# Patient Record
Sex: Male | Born: 1970 | Race: White | Hispanic: No | Marital: Single | Smoking: Current every day smoker
Health system: Southern US, Community
[De-identification: ages and names within clinical notes are randomized; demographics above are authoritative.]

## PROBLEM LIST (undated history)

## (undated) HISTORY — PX: HERNIA REPAIR: SHX51

---

## 2007-02-10 ENCOUNTER — Emergency Department (HOSPITAL_COMMUNITY): Admission: EM | Admit: 2007-02-10 | Discharge: 2007-02-10 | Payer: Self-pay | Admitting: Emergency Medicine

## 2009-10-08 ENCOUNTER — Emergency Department (HOSPITAL_COMMUNITY): Admission: EM | Admit: 2009-10-08 | Discharge: 2009-10-08 | Payer: Self-pay | Admitting: Emergency Medicine

## 2010-12-03 LAB — DIFFERENTIAL
Basophils Absolute: 0.1 10*3/uL (ref 0.0–0.1)
Basophils Relative: 1 % (ref 0–1)
Eosinophils Absolute: 0.5 10*3/uL (ref 0.0–0.7)
Eosinophils Relative: 5 % (ref 0–5)
Lymphs Abs: 2.1 10*3/uL (ref 0.7–4.0)
Neutrophils Relative %: 68 % (ref 43–77)

## 2010-12-03 LAB — CBC
HCT: 44.2 % (ref 39.0–52.0)
MCHC: 34 g/dL (ref 30.0–36.0)
MCV: 88.2 fL (ref 78.0–100.0)
Platelets: 304 10*3/uL (ref 150–400)
RDW: 15.2 % (ref 11.5–15.5)

## 2010-12-03 LAB — BASIC METABOLIC PANEL
BUN: 16 mg/dL (ref 6–23)
CO2: 29 mEq/L (ref 19–32)
Chloride: 101 mEq/L (ref 96–112)
Creatinine, Ser: 1.19 mg/dL (ref 0.4–1.5)
Glucose, Bld: 118 mg/dL — ABNORMAL HIGH (ref 70–99)
Potassium: 3.6 mEq/L (ref 3.5–5.1)

## 2013-01-27 ENCOUNTER — Encounter (HOSPITAL_COMMUNITY): Payer: Self-pay

## 2013-01-27 ENCOUNTER — Emergency Department (HOSPITAL_COMMUNITY)
Admission: EM | Admit: 2013-01-27 | Discharge: 2013-01-27 | Disposition: A | Discharge status: Home / Self Care | Payer: PRIVATE HEALTH INSURANCE | Attending: Emergency Medicine | Admitting: Emergency Medicine

## 2013-01-27 DIAGNOSIS — S0181XA Laceration without foreign body of other part of head, initial encounter: Secondary | ICD-10-CM

## 2013-01-27 DIAGNOSIS — S0180XA Unspecified open wound of other part of head, initial encounter: Secondary | ICD-10-CM | POA: Insufficient documentation

## 2013-01-27 DIAGNOSIS — F17200 Nicotine dependence, unspecified, uncomplicated: Secondary | ICD-10-CM | POA: Diagnosis not present

## 2013-01-27 DIAGNOSIS — Z8719 Personal history of other diseases of the digestive system: Secondary | ICD-10-CM | POA: Insufficient documentation

## 2013-01-27 DIAGNOSIS — S0993XA Unspecified injury of face, initial encounter: Secondary | ICD-10-CM | POA: Diagnosis present

## 2013-01-27 MED ORDER — LIDOCAINE-EPINEPHRINE (PF) 1 %-1:200000 IJ SOLN
INTRAMUSCULAR | Status: AC
  Administered 2013-01-27: 10 mL
  Filled 2013-01-27: qty 10

## 2013-01-27 MED ORDER — BACITRACIN ZINC 500 UNIT/GM EX OINT
TOPICAL_OINTMENT | CUTANEOUS | Status: AC
  Administered 2013-01-27: 1
  Filled 2013-01-27: qty 1.8
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
## 2013-01-28 NOTE — ED Provider Notes (Signed)
Medical screening examination/treatment/procedure(s) were performed by non-physician practitioner and as supervising physician I was immediately available for consultation/collaboration.  Raeford Razor, MD 01/28/13 1714

## 2020-03-03 ENCOUNTER — Emergency Department (HOSPITAL_COMMUNITY)
Admission: EM | Admit: 2020-03-03 | Discharge: 2020-03-03 | Disposition: A | Discharge status: Home / Self Care | Payer: Medicare Other | Attending: Emergency Medicine | Admitting: Emergency Medicine

## 2020-03-03 ENCOUNTER — Other Ambulatory Visit: Payer: Self-pay

## 2020-03-03 ENCOUNTER — Encounter (HOSPITAL_COMMUNITY): Payer: Self-pay | Admitting: Emergency Medicine

## 2020-03-03 DIAGNOSIS — L02413 Cutaneous abscess of right upper limb: Secondary | ICD-10-CM | POA: Diagnosis present

## 2020-03-03 DIAGNOSIS — Z5321 Procedure and treatment not carried out due to patient leaving prior to being seen by health care provider: Secondary | ICD-10-CM | POA: Diagnosis not present

## 2020-03-03 LAB — CBC
HCT: 38.1 % — ABNORMAL LOW (ref 39.0–52.0)
Hemoglobin: 12.5 g/dL — ABNORMAL LOW (ref 13.0–17.0)
MCH: 29.2 pg (ref 26.0–34.0)
MCHC: 32.8 g/dL (ref 30.0–36.0)
MCV: 89 fL (ref 80.0–100.0)
Platelets: 319 10*3/uL (ref 150–400)
RBC: 4.28 MIL/uL (ref 4.22–5.81)
RDW: 14.9 % (ref 11.5–15.5)
WBC: 9.6 10*3/uL (ref 4.0–10.5)
nRBC: 0 % (ref 0.0–0.2)

## 2020-03-03 LAB — BASIC METABOLIC PANEL
Anion gap: 8 (ref 5–15)
BUN: 18 mg/dL (ref 6–20)
CO2: 26 mmol/L (ref 22–32)
Calcium: 8.5 mg/dL — ABNORMAL LOW (ref 8.9–10.3)
Chloride: 101 mmol/L (ref 98–111)
Creatinine, Ser: 1.14 mg/dL (ref 0.61–1.24)
GFR calc Af Amer: >60 mL/min (ref >60)
GFR calc non Af Amer: >60 mL/min (ref >60)
Glucose, Bld: 81 mg/dL (ref 70–99)
Potassium: 3.7 mmol/L (ref 3.5–5.1)
Sodium: 135 mmol/L (ref 135–145)

## 2020-03-03 NOTE — ED Notes (Signed)
Pt standing at the door as this RN was walking by. RN asked if pt needed anything, pt stated "I need to see someone cause I'm in so much god damn pain."

## 2020-03-03 NOTE — ED Triage Notes (Signed)
Patient has abscess on right arm that has been there x 2 days. Patient states severe pain x 2 hours ago. Patient does have swelling and redness that has spread up his right arm.

## 2020-03-03 NOTE — ED Notes (Signed)
Pt approached Nurse Station cursing at ED Staff. Pt then told this RN to "Suck my [employer] notified by Pts nurse and Pt walked out of department.

## 2020-03-03 NOTE — ED Notes (Signed)
Pt pacing the hallway, when RN told pt he would need to go back to his room pt stated he "didn't have enough room to walk around in there. And all of y'all are walking around here." When this RN reiterated to pt that he would need to go back to his room, pt started charging RN stating "I can do whatever the fuck I want bitch." security called and pt walking out.

## 2020-03-04 ENCOUNTER — Encounter (HOSPITAL_COMMUNITY): Payer: Self-pay | Admitting: Emergency Medicine

## 2020-03-04 ENCOUNTER — Other Ambulatory Visit: Payer: Self-pay

## 2020-03-04 ENCOUNTER — Emergency Department (HOSPITAL_COMMUNITY)
Admission: EM | Admit: 2020-03-04 | Discharge: 2020-03-04 | Disposition: A | Discharge status: Home / Self Care | Payer: Medicare Other | Attending: Emergency Medicine | Admitting: Emergency Medicine

## 2020-03-04 DIAGNOSIS — L089 Local infection of the skin and subcutaneous tissue, unspecified: Secondary | ICD-10-CM | POA: Diagnosis not present

## 2020-03-04 DIAGNOSIS — L02413 Cutaneous abscess of right upper limb: Secondary | ICD-10-CM | POA: Diagnosis present

## 2020-03-04 MED ORDER — DOXYCYCLINE HYCLATE 100 MG PO TABS
100.0000 mg | ORAL_TABLET | Freq: Once | ORAL | Status: AC
  Administered 2020-03-04: 100 mg via ORAL
  Filled 2020-03-04: qty 1

## 2020-03-04 MED ORDER — DOXYCYCLINE HYCLATE 100 MG PO CAPS: 100.0000 mg | ORAL_CAPSULE | Freq: Two times a day (BID) | ORAL | 0 refills | Status: AC

## 2020-03-04 NOTE — ED Provider Notes (Signed)
[facility] EMERGENCY DEPARTMENT Provider Note   CSN: [id number] Arrival date & time: 03/04/20  2094     History Chief Complaint  Patient presents with  . Abscess    Spencer Brown is a 49 y.o. male presenting with abscess.  Onset 3 days on arm.  Started with small cut, doesn't remember what cut it.  Had some drainage earlier.  Felt febrile.  He was seen at this hospital in another hospital in the past 24 hours and left from both places prior to being seen due to personal disagreements with the staff.  He has not been on antibiotics.  He does report his a prior history of "staph infection" in his other arm.    HPI     History reviewed. No pertinent past medical history.  There are no problems to display for this patient.   Past Surgical History:  Procedure Laterality Date  . HERNIA REPAIR         History reviewed. No pertinent family history.  Social History   Tobacco Use  . Smoking status: Current Every Day Smoker    Types: Cigarettes  . Smokeless tobacco: Never Used  Substance Use Topics  . Alcohol use: No  . Drug use: No    Home Medications Prior to Admission medications   Medication Sig Start Date End Date Taking? Authorizing Provider  doxycycline (VIBRAMYCIN) 100 MG capsule Take 1 capsule (100 mg total) by mouth 2 (two) times daily for 7 days. 03/04/20 03/11/20  Terald Sleeper, MD  ibuprofen (ADVIL,MOTRIN) 200 MG tablet Take 200 mg by mouth every 6 (six) hours as needed for pain.    [provider]    Allergies    Haldol [haloperidol lactate]  Review of Systems   Review of Systems  Constitutional: Positive for chills and fever.  Eyes: Negative for pain and visual disturbance.  Respiratory: Negative for cough and shortness of breath.   Cardiovascular: Negative for chest pain and palpitations.  Gastrointestinal: Negative for abdominal pain and vomiting.  Musculoskeletal: Negative for arthralgias and back pain.  Skin: Positive for  rash and wound.  Allergic/Immunologic: Negative for food allergies and immunocompromised state.  Neurological: Negative for syncope and speech difficulty.  Psychiatric/Behavioral: Negative for agitation and confusion.  All other systems reviewed and are negative.   Physical Exam Updated Vital Signs BP 110/64   Pulse 99   Resp 12   Ht 6\' 1"  (1.854 m)   Wt 68 kg   SpO2 97%   BMI 19.79 kg/m   Physical Exam Vitals and nursing note reviewed.  Constitutional:      Appearance: He is well-developed.  HENT:     Head: Normocephalic and atraumatic.  Eyes:     Conjunctiva/sclera: Conjunctivae normal.     Pupils: Pupils are equal, round, and reactive to light.  Cardiovascular:     Rate and Rhythm: Normal rate and regular rhythm.     Pulses: Normal pulses.  Pulmonary:     Effort: Pulmonary effort is normal. No respiratory distress.  Musculoskeletal:     Cervical back: Neck supple.     Comments: 3 cm firm nodule on right mid-forearm with small ulceration, no active drainage, no fluctuance, mild surrounding streaking erythema No significant flexor tendinopathy   Skin:    General: Skin is warm and dry.  Neurological:     General: No focal deficit present.     Mental Status: He is alert and oriented to person, place, and time.  ED Results / Procedures / Treatments   Labs (all labs ordered are listed, but only abnormal results are displayed) Labs Reviewed - No data to display  EKG None  Radiology No results found.  Procedures Procedures (including critical care time)  Medications Ordered in ED Medications  doxycycline (VIBRA-TABS) tablet 100 mg (100 mg Oral Given 03/04/20 3151)    ED Course  I have reviewed the triage vital signs and the nursing notes.  Pertinent labs & imaging results that were available during my care of the patient were reviewed by me and considered in my medical decision making (see chart for details).  49 year old male present emergency  department with nodule and skin infection on the right forearm.  This may been an abscess that spontaneously was draining earlier.  Examined this with the bedside ultrasound and found an approximate 2 cm deep region of tissue swelling underneath the skin, not extending into the tendon sheaths, without fluctuance or hypoechoic collection.  There is fluctuance on exam.  I do not think there is a drainable mass here, but rather tissue swelling.  I would proceed with doxycycline for MRSA coverage given his prior history of "staph infection" as well as his temperature here.  He is in agreement with this plan.  I have a low clinical suspicion for sepsis or bacteremia at the time of this presentation.  The patient is overall well appearing to me.    Final Clinical Impression(s) / ED Diagnoses Final diagnoses:  Skin infection    Rx / DC Orders ED Discharge Orders         Ordered    doxycycline (VIBRAMYCIN) 100 MG capsule  2 times daily     Discontinue  Reprint     03/04/20 0740           Wyvonnia Dusky, MD 03/04/20 [phone]

## 2020-03-04 NOTE — ED Triage Notes (Signed)
Pt c/o of abscess on right arm x 3 day. 100.3 temp present.

## 2020-09-27 ENCOUNTER — Other Ambulatory Visit: Payer: Self-pay

## 2020-09-27 ENCOUNTER — Emergency Department (HOSPITAL_COMMUNITY)
Admission: EM | Admit: 2020-09-27 | Discharge: 2020-09-27 | Disposition: A | Discharge status: Home / Self Care | Payer: Medicare Other | Attending: Emergency Medicine | Admitting: Emergency Medicine

## 2020-09-27 DIAGNOSIS — N485 Ulcer of penis: Secondary | ICD-10-CM | POA: Insufficient documentation

## 2020-09-27 DIAGNOSIS — F17210 Nicotine dependence, cigarettes, uncomplicated: Secondary | ICD-10-CM | POA: Diagnosis not present

## 2020-09-27 DIAGNOSIS — N4889 Other specified disorders of penis: Secondary | ICD-10-CM | POA: Diagnosis present

## 2020-09-27 MED ORDER — DOXYCYCLINE HYCLATE 100 MG PO CAPS: 100.0000 mg | ORAL_CAPSULE | Freq: Two times a day (BID) | ORAL | 0 refills | Status: DC

## 2020-09-27 NOTE — ED Notes (Signed)
Discharge paperwork given to pt along with prescriptions. Pt agreeable to discharge and understands instructions. Esignature pad not working. 

## 2020-09-27 NOTE — ED Triage Notes (Signed)
Pt reports bug bite on penis yesterday. Today swollen and red.

## 2020-09-27 NOTE — ED Provider Notes (Signed)
[facility] EMERGENCY DEPARTMENT Provider Note   CSN: [id number] Arrival date & time: 09/27/20  1336     History No chief complaint on file.   Spencer Brown is a 50 y.o. male.  HPI 50 year old male who presents to the ER with a wound to his penis.  States he thinks he got bitten by a spider.  Reports that he is out in the woods a lot.  States he noticed a black wound at the top of the penis, has been itchy.  He was itching it last night, noticed the wound today.  Denies any dysuria, hematuria, pain with urination.  No testicular pain.  States he is just afraid that he is going to "lose his penis".  Denies any fevers or chills.  No noticeable pus or discharge.  He is sexually active with one partner and they do not use the barrier method.     No past medical history on file.  There are no problems to display for this patient.   Past Surgical History:  Procedure Laterality Date  . HERNIA REPAIR         No family history on file.  Social History   Tobacco Use  . Smoking status: Current Every Day Smoker    Types: Cigarettes  . Smokeless tobacco: Never Used  Substance Use Topics  . Alcohol use: No  . Drug use: No    Home Medications Prior to Admission medications   Medication Sig Start Date End Date Taking? Authorizing Provider  doxycycline (VIBRAMYCIN) 100 MG capsule Take 1 capsule (100 mg total) by mouth 2 (two) times daily. 09/27/20  Yes Mare Ferrari, PA-C  ibuprofen (ADVIL,MOTRIN) 200 MG tablet Take 200 mg by mouth every 6 (six) hours as needed for pain.    [provider]    Allergies    Haldol [haloperidol lactate]  Review of Systems   Review of Systems  Constitutional: Negative for chills and fever.  Cardiovascular: Negative for chest pain and palpitations.  Gastrointestinal: Negative for abdominal pain.  Genitourinary: Positive for genital sores. Negative for dysuria and hematuria.  Musculoskeletal: Negative for back  pain.  Skin: Positive for wound. Negative for color change and rash.  Hematological: Negative for adenopathy.  All other systems reviewed and are negative.   Physical Exam Updated Vital Signs BP 128/81 (BP Location: Right Arm)   Pulse 78   Temp 98 F (36.7 C) (Oral)   Resp 16   SpO2 100%   Physical Exam Vitals and nursing note reviewed. Exam conducted with a chaperone present.  Constitutional:      Appearance: He is well-developed and well-nourished.  HENT:     Head: Normocephalic and atraumatic.  Eyes:     Conjunctiva/sclera: Conjunctivae normal.  Cardiovascular:     Rate and Rhythm: Normal rate and regular rhythm.     Heart sounds: No murmur heard.   Pulmonary:     Effort: Pulmonary effort is normal. No respiratory distress.     Breath sounds: Normal breath sounds.  Abdominal:     Palpations: Abdomen is soft.     Tenderness: There is no abdominal tenderness.  Genitourinary:    Testes:        Right: Tenderness not present.        Left: Tenderness not present.     Epididymis:     Right: Normal.     Left: Normal.     Comments: Chaperone present.  Patient with a ulcerative appearing lesion  on the shaft of the penis below the glans.  No visible pus or discharge.  Nontender scrotum, no testicular or epididymal tenderness.  No visible erythema or discharge at the urethral orifice.  Nontender penile shaft. No visible warts Musculoskeletal:        General: No edema.     Cervical back: Neck supple.  Skin:    General: Skin is warm and dry.  Neurological:     Mental Status: He is alert.  Psychiatric:        Mood and Affect: Mood and affect normal.       ED Results / Procedures / Treatments   Labs (all labs ordered are listed, but only abnormal results are displayed) Labs Reviewed  RPR    EKG None  Radiology No results found.  Procedures Procedures (including critical care time)  Medications Ordered in ED Medications - No data to display  ED Course  I  have reviewed the triage vital signs and the nursing notes.  Pertinent labs & imaging results that were available during my care of the patient were reviewed by me and considered in my medical decision making (see chart for details).    MDM Rules/Calculators/A&P                          50 year old male who presents with a wound on the penis.  No difficulty urinating, dysuria, penile discharge.  Unclear if this is a bug bite or possible syphilis.  Low suspicion for gonorrhea/chlamydia, UTI, testicular torsion, epididymitis.  Will test RPR today.  Will start on doxycycline, and instructed the patient to keep the wound clean and dry.  Encouraged follow-up on the results via the MyChart app.  Referred to urology, stressed follow-up.  Return precautions discussed.  He voiced understanding and is agreeable.  Discussed the case with Dr. Effie Shy who is agreeable to the above plan and disposition  Final Clinical Impression(s) / ED Diagnoses Final diagnoses:  Penile ulcer    Rx / DC Orders ED Discharge Orders         Ordered    doxycycline (VIBRAMYCIN) 100 MG capsule  2 times daily        09/27/20 1606           Leone Brand 09/27/20 1612    Mancel Bale, MD 09/28/20 1151

## 2020-09-27 NOTE — Discharge Instructions (Signed)
Please keep the area clean and dry. Take antibiotic as prescribed until finished. Please follow up with [facility] whose phone number I have provided in your discharge paperwork.  It is very important that you follow-up with them to ensure appropriate healing of your wound.  Your test for syphilis will be available via the MyChart.  There is instructions in your discharge paperwork on how to download this.  Return to the ER for any new or worsening symptoms.

## 2020-09-28 ENCOUNTER — Telehealth: Payer: Self-pay

## 2020-09-28 LAB — RPR: RPR Ser Ql: NONREACTIVE

## 2020-09-28 NOTE — Telephone Encounter (Signed)
Patient called and asked for a f/u appointment from his ER visit. I asked his name and DOB and he reluctantly gave it me. I looked up his ER D/C summary to see why he needed to be seen because the patient would not tell me. I saw that he had a penile ulcer as the diagnosis and I told him I needed to talk to our nurse and get approval to add him to this weeks schedule. He proceeded to scream at me and called me a damn bitch repeatedly and also said that I needed to find a new job. He then said for me to take his name out of my computer and hung up.

## 2022-02-05 ENCOUNTER — Telehealth (HOSPITAL_COMMUNITY): Payer: Self-pay

## 2022-02-05 NOTE — Telephone Encounter (Signed)
Patient called in requesting services for behavioral health. I asked patient questions to see exactly what he was needing, (referral, therapist, med mgt) tried to explain each, but patient became very angry as he stated I was going in circles and not stating what he needed. Patient began to get very rude and cursed, warned patient, but continued to speak bad to me. Patient then stated that is not what the F** I asked you, ended called.

## 2022-02-19 ENCOUNTER — Ambulatory Visit (INDEPENDENT_AMBULATORY_CARE_PROVIDER_SITE_OTHER): Payer: Medicare Other

## 2022-02-19 ENCOUNTER — Encounter: Payer: Self-pay | Admitting: Nurse Practitioner

## 2022-02-19 ENCOUNTER — Ambulatory Visit (INDEPENDENT_AMBULATORY_CARE_PROVIDER_SITE_OTHER): Payer: Medicare Other | Admitting: Nurse Practitioner

## 2022-02-19 VITALS — BP 114/73 | HR 69 | Temp 98.7°F | Ht 73.0 in | Wt 149.4 lb

## 2022-02-19 DIAGNOSIS — M25511 Pain in right shoulder: Secondary | ICD-10-CM

## 2022-02-19 DIAGNOSIS — G8929 Other chronic pain: Secondary | ICD-10-CM | POA: Diagnosis not present

## 2022-02-19 DIAGNOSIS — Z0000 Encounter for general adult medical examination without abnormal findings: Secondary | ICD-10-CM

## 2022-02-19 DIAGNOSIS — Z7689 Persons encountering health services in other specified circumstances: Secondary | ICD-10-CM | POA: Insufficient documentation

## 2022-02-19 LAB — COMPREHENSIVE METABOLIC PANEL
Albumin/Globulin Ratio: 1.8 (ref 1.2–2.2)
Albumin: 4.5 g/dL (ref 4.0–5.0)
BUN/Creatinine Ratio: 11 (ref 9–20)
CO2: 25 mmol/L (ref 20–29)
Calcium: 9.5 mg/dL (ref 8.7–10.2)
Globulin, Total: 2.5 g/dL (ref 1.5–4.5)
Glucose: 93 mg/dL (ref 70–99)
Potassium: 4.4 mmol/L (ref 3.5–5.2)

## 2022-02-19 LAB — CBC WITH DIFFERENTIAL/PLATELET
EOS (ABSOLUTE): 0.2 10*3/uL (ref 0.0–0.4)
Lymphocytes Absolute: 1.5 10*3/uL (ref 0.7–3.1)
Lymphs: 22 %
MCH: 30.5 pg (ref 26.6–33.0)
MCHC: 33.6 g/dL (ref 31.5–35.7)
Monocytes Absolute: 0.5 10*3/uL (ref 0.1–0.9)
Monocytes: 8 %
Neutrophils Absolute: 4.6 10*3/uL (ref 1.4–7.0)
WBC: 7 10*3/uL (ref 3.4–10.8)

## 2022-02-19 LAB — LIPID PANEL

## 2022-02-19 MED ORDER — IBUPROFEN 600 MG PO TABS: 600.0000 mg | ORAL_TABLET | Freq: Three times a day (TID) | ORAL | 0 refills | Status: DC | PRN

## 2022-02-19 NOTE — Assessment & Plan Note (Signed)
Patient is new establishing care today provided education to patient on preventative care and healthcare maintenance.  Printed handouts given.  Patient declined his tetanus shot, Cologuard, and Shingrix.

## 2022-02-19 NOTE — Assessment & Plan Note (Signed)
Patient has not had a physical since 2008.  He is new to practice today completed head to toe assessment.  Completed labs CBC, CMP, lipid panel, HIV, hepatitis C antibody.  Follow-up in one year
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
## 2022-02-19 NOTE — Assessment & Plan Note (Signed)
Worsening chronic shoulder pain.  Completed right shoulder x-ray, anti-inflammatory for pain, in the future for physical therapy and orthopedic referral will be helpful to help patient alleviate symptoms.  Follow-up with worsening unresolved symptoms

## 2022-02-19 NOTE — Patient Instructions (Signed)
Health Maintenance, Male Adopting a healthy lifestyle and getting preventive care are important in promoting health and wellness. Ask your health care provider about: The right schedule for you to have regular tests and exams. Things you can do on your own to prevent diseases and keep yourself healthy. What should I know about diet, weight, and exercise? Eat a healthy diet  Eat a diet that includes plenty of vegetables, fruits, low-fat dairy products, and lean protein. Do not eat a lot of foods that are high in solid fats, added sugars, or sodium. Maintain a healthy weight Body mass index (BMI) is a measurement that can be used to identify possible weight problems. It estimates body fat based on height and weight. Your health care provider can help determine your BMI and help you achieve or maintain a healthy weight. Get regular exercise Get regular exercise. This is one of the most important things you can do for your health. Most adults should: Exercise for at least 150 minutes each week. The exercise should increase your heart rate and make you sweat (moderate-intensity exercise). Do strengthening exercises at least twice a week. This is in addition to the moderate-intensity exercise. Spend less time sitting. Even light physical activity can be beneficial. Watch cholesterol and blood lipids Have your blood tested for lipids and cholesterol at 50 years of age, then have this test every 5 years. You may need to have your cholesterol levels checked more often if: Your lipid or cholesterol levels are high. You are older than 50 years of age. You are at high risk for heart disease. What should I know about cancer screening? Many types of cancers can be detected early and may often be prevented. Depending on your health history and family history, you may need to have cancer screening at various ages. This may include screening for: Colorectal cancer. Prostate cancer. Skin cancer. Lung  cancer. What should I know about heart disease, diabetes, and high blood pressure? Blood pressure and heart disease High blood pressure causes heart disease and increases the risk of stroke. This is more likely to develop in people who have high blood pressure readings or are overweight. Talk with your health care provider about your target blood pressure readings. Have your blood pressure checked: Every 3-5 years if you are 18-39 years of age. Every year if you are 40 years old or older. If you are between the ages of 65 and 75 and are a current or former smoker, ask your health care provider if you should have a one-time screening for abdominal aortic aneurysm (AAA). Diabetes Have regular diabetes screenings. This checks your fasting blood sugar level. Have the screening done: Once every three years after age 45 if you are at a normal weight and have a low risk for diabetes. More often and at a younger age if you are overweight or have a high risk for diabetes. What should I know about preventing infection? Hepatitis B If you have a higher risk for hepatitis B, you should be screened for this virus. Talk with your health care provider to find out if you are at risk for hepatitis B infection. Hepatitis C Blood testing is recommended for: Everyone born from 1945 through 1965. Anyone with known risk factors for hepatitis C. Sexually transmitted infections (STIs) You should be screened each year for STIs, including gonorrhea and chlamydia, if: You are sexually active and are younger than 50 years of age. You are older than 50 years of age and your   health care provider tells you that you are at risk for this type of infection. Your sexual activity has changed since you were last screened, and you are at increased risk for chlamydia or gonorrhea. Ask your health care provider if you are at risk. Ask your health care provider about whether you are at high risk for HIV. Your health care provider  may recommend a prescription medicine to help prevent HIV infection. If you choose to take medicine to prevent HIV, you should first get tested for HIV. You should then be tested every 3 months for as long as you are taking the medicine. Follow these instructions at home: Alcohol use Do not drink alcohol if your health care provider tells you not to drink. If you drink alcohol: Limit how much you have to 0-2 drinks a day. Know how much alcohol is in your drink. In the [place] one drink equals one 12 oz bottle of beer (355 mL), one 5 oz glass of wine (148 mL), or one 1 oz glass of hard liquor (44 mL). Lifestyle Do not use any products that contain nicotine or tobacco. These products include cigarettes, chewing tobacco, and vaping devices, such as e-cigarettes. If you need help quitting, ask your health care provider. Do not use street drugs. Do not share needles. Ask your health care provider for help if you need support or information about quitting drugs. General instructions Schedule regular health, dental, and eye exams. Stay current with your vaccines. Tell your health care provider if: You often feel depressed. You have ever been abused or do not feel safe at home. Summary Adopting a healthy lifestyle and getting preventive care are important in promoting health and wellness. Follow your health care provider's instructions about healthy diet, exercising, and getting tested or screened for diseases. Follow your health care provider's instructions on monitoring your cholesterol and blood pressure. This information is not intended to replace advice given to you by your health care provider. Make sure you discuss any questions you have with your health care provider. Document Revised: 01/22/2021 Document Reviewed: 01/22/2021 Elsevier Patient Education  2023 [employer] Shoulder Pain Many things can cause shoulder pain, including: An injury. Moving the shoulder in the same way again and  again (overuse). Joint pain (arthritis). Pain can come from: Swelling and irritation (inflammation) of any part of the shoulder. An injury to the shoulder joint. An injury to: Tissues that connect muscle to bone (tendons). Tissues that connect bones to each other (ligaments). Bones. Follow these instructions at home: Watch for changes in your symptoms. Let your doctor know about them. Follow these instructions to help with your pain. If you have a sling: Wear the sling as told by your doctor. Remove it only as told by your doctor. Loosen the sling if your fingers: Tingle. Become numb. Turn cold and blue. Keep the sling clean. If the sling is not waterproof: Do not let it get wet. Take the sling off when you shower or bathe. Managing pain, stiffness, and swelling  If told, put ice on the painful area: Put ice in a plastic bag. Place a towel between your skin and the bag. Leave the ice on for 20 minutes, 2-3 times a day. Stop putting ice on if it does not help with the pain. Squeeze a soft ball or a foam pad as much as possible. This prevents swelling in the shoulder. It also helps to strengthen the arm. General instructions Take over-the-counter and prescription medicines only as told  by your doctor. Keep all follow-up visits as told by your doctor. This is important. Contact a doctor if: Your pain gets worse. Medicine does not help your pain. You have new pain in your arm, hand, or fingers. Get help right away if: Your arm, hand, or fingers: Tingle. Are numb. Are swollen. Are painful. Turn white or blue. Summary Shoulder pain can be caused by many things. These include injury, moving the shoulder in the same away again and again, and joint pain. Watch for changes in your symptoms. Let your doctor know about them. This condition may be treated with a sling, ice, and pain medicine. Contact your doctor if the pain gets worse or you have new pain. Get help right away if your  arm, hand, or fingers tingle or get numb, swollen, or painful. Keep all follow-up visits as told by your doctor. This is important. This information is not intended to replace advice given to you by your health care provider. Make sure you discuss any questions you have with your health care provider. Document Revised: 05/18/2021 Document Reviewed: 05/18/2021 Elsevier Patient Education  2023 [employer].

## 2022-02-20 LAB — COMPREHENSIVE METABOLIC PANEL
ALT: 21 IU/L (ref 0–44)
AST: 22 IU/L (ref 0–40)
Alkaline Phosphatase: 83 IU/L (ref 44–121)
BUN: 11 mg/dL (ref 6–24)
Bilirubin Total: 0.4 mg/dL (ref 0.0–1.2)
Chloride: 104 mmol/L (ref 96–106)
Creatinine, Ser: 1.01 mg/dL (ref 0.76–1.27)
Sodium: 141 mmol/L (ref 134–144)
Total Protein: 7 g/dL (ref 6.0–8.5)
eGFR: 91 mL/min/{1.73_m2} (ref >59)

## 2022-02-20 LAB — HEPATITIS C ANTIBODY: Hep C Virus Ab: NONREACTIVE

## 2022-02-20 LAB — CBC WITH DIFFERENTIAL/PLATELET
Basophils Absolute: 0.1 10*3/uL (ref 0.0–0.2)
Basos: 1 %
Eos: 3 %
Hematocrit: 45.6 % (ref 37.5–51.0)
Hemoglobin: 15.3 g/dL (ref 13.0–17.7)
Immature Grans (Abs): 0 10*3/uL (ref 0.0–0.1)
Immature Granulocytes: 0 %
MCV: 91 fL (ref 79–97)
Neutrophils: 66 %
Platelets: 374 10*3/uL (ref 150–450)
RBC: 5.02 x10E6/uL (ref 4.14–5.80)
RDW: 13.2 % (ref 11.6–15.4)

## 2022-02-20 LAB — LIPID PANEL
Chol/HDL Ratio: 4.1 ratio (ref 0.0–5.0)
Cholesterol, Total: 174 mg/dL (ref 100–199)
VLDL Cholesterol Cal: 19 mg/dL (ref 5–40)

## 2022-02-20 LAB — HIV ANTIBODY (ROUTINE TESTING W REFLEX): HIV Screen 4th Generation wRfx: NONREACTIVE

## 2022-03-28 ENCOUNTER — Ambulatory Visit (INDEPENDENT_AMBULATORY_CARE_PROVIDER_SITE_OTHER): Payer: Medicare Other | Admitting: Family Medicine

## 2022-03-28 ENCOUNTER — Encounter: Payer: Self-pay | Admitting: Family Medicine

## 2022-03-28 VITALS — BP 119/74 | HR 91 | Temp 97.7°F | Ht 73.0 in | Wt 143.8 lb

## 2022-03-28 DIAGNOSIS — L02212 Cutaneous abscess of back [any part, except buttock]: Secondary | ICD-10-CM

## 2022-03-28 MED ORDER — SULFAMETHOXAZOLE-TRIMETHOPRIM 800-160 MG PO TABS: 1.0000 | ORAL_TABLET | Freq: Two times a day (BID) | ORAL | 0 refills | Status: AC

## 2022-03-28 NOTE — Progress Notes (Signed)
Assessment & Plan:  1. Abscess of back - sulfamethoxazole-trimethoprim (BACTRIM DS) 800-160 MG tablet; Take 1 tablet by mouth 2 (two) times daily for 7 days.  Dispense: 14 tablet; Refill: 0 - I & D   Follow up plan: Return if symptoms worsen or fail to improve.  [name], MSN, APRN, FNP-C [facility]  Subjective:   Patient ID: Spencer Brown, male    DOB: [birth year], 51 y.o.   MRN: [id number]  HPI: Spencer Brown is a 51 y.o. male presenting on 03/28/2022 for Cyst (Patient states that he has had a cyst on his back since the beginning of the year.)  Patient reports a place on his back that has been present since the beginning of the year. He recently grabbed it with pliers at which time a large amount of pus came out. 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   ROS: Negative unless specifically indicated above in HPI.   Relevant past medical history reviewed and updated as indicated.   Allergies and medications reviewed and updated.   Current Outpatient Medications:    ibuprofen (ADVIL) 600 MG tablet, Take 1 tablet (600 mg total) by mouth every 8 (eight) hours as needed. (Patient not taking: Reported on 03/28/2022), Disp: 30 tablet, Rfl: 0  Allergies  Allergen Reactions   Haloperidol Other (See Comments)    Neck stiffness Other reaction(s): Other (See Comments) Neck stiffness Muscle stiffness/ seizure like symptoms     Bee Pollen    Haldol [Haloperidol Lactate] Other (See Comments)    Muscle stiffness/ seizure like symptoms     Objective:   BP 119/74   Pulse 91   Temp 97.7 F (36.5 C) (Temporal)   Ht 6\' 1"  (1.854 m)   Wt 143 lb 12.8 oz (65.2 kg)   SpO2 99%   BMI 18.97 kg/m    Physical Exam Vitals reviewed.  Constitutional:      General: He is not in acute distress.    Appearance: Normal appearance. He is not ill-appearing, toxic-appearing or diaphoretic.  HENT:     Head: Normocephalic and atraumatic.  Eyes:      General: No scleral icterus.       Right eye: No discharge.        Left eye: No discharge.     Conjunctiva/sclera: Conjunctivae normal.  Cardiovascular:     Rate and Rhythm: Normal rate.  Pulmonary:     Effort: Pulmonary effort is normal. No respiratory distress.  Musculoskeletal:        General: Normal range of motion.     Cervical back: Normal range of motion.  Skin:    General: Skin is warm and dry.     Findings: Abscess (left side of back) present.  Neurological:     Mental Status: He is alert and oriented to person, place, and time. Mental status is at baseline.  Psychiatric:        Mood and Affect: Mood normal.        Behavior: Behavior normal.        Thought Content: Thought content normal.        Judgment: Judgment normal.   I & D  Date/Time: 03/28/2022 8:15 AM  Performed by: 03/30/2022, FNP Authorized by: Gwenlyn Fudge, FNP   Consent:    Consent obtained:  Written   Consent given by:  Patient   Risks, benefits, and alternatives were discussed: yes     Risks  discussed:  Bleeding, incomplete drainage, pain and infection   Alternatives discussed:  Alternative treatment Location:    Type:  Abscess   Size:  1.5 cm   Location:  Trunk   Trunk location:  Back Pre-procedure details:    Skin preparation:  Alcohol and povidone-iodine Sedation:    Sedation type:  None Anesthesia:    Anesthesia method:  Topical application   Topical anesthesia: Ethyl chloride. Procedure type:    Complexity:  Simple Procedure details:    Needle aspiration: no     Incision types:  Stab incision   Incision depth:  Dermal   Scalpel blade:  11   Wound management:  Probed and deloculated   Drainage:  Bloody and purulent   Drainage amount:  Moderate   Wound treatment:  Wound left open   Packing materials:  None Post-procedure details:    Procedure completion:  Tolerated well, no immediate complications

## 2022-07-31 ENCOUNTER — Telehealth: Payer: Self-pay | Admitting: Nurse Practitioner

## 2022-07-31 NOTE — Telephone Encounter (Signed)
Spoke with patient to schedule AWVI and he has questions about giving stool sample.

## 2022-07-31 NOTE — Telephone Encounter (Signed)
Am not sure what patient is asking?

## 2022-07-31 NOTE — Telephone Encounter (Signed)
Attempted to call pt no answer left vm  

## 2022-08-07 ENCOUNTER — Telehealth: Payer: Self-pay

## 2022-08-07 NOTE — Telephone Encounter (Signed)
I have attempted to call pt a 2nd time no answer - left vm for cb - closing the encounter

## 2022-08-12 ENCOUNTER — Ambulatory Visit (INDEPENDENT_AMBULATORY_CARE_PROVIDER_SITE_OTHER): Payer: Medicare Other

## 2022-08-12 ENCOUNTER — Telehealth: Payer: Self-pay | Admitting: Nurse Practitioner

## 2022-08-12 VITALS — Ht 73.0 in | Wt 145.0 lb

## 2022-08-12 DIAGNOSIS — Z0000 Encounter for general adult medical examination without abnormal findings: Secondary | ICD-10-CM

## 2022-08-12 NOTE — Patient Instructions (Signed)
[name] , Thank you for taking time to come for your Medicare Wellness Visit. I appreciate your ongoing commitment to your health goals. Please review the following plan we discussed and let me know if I can assist you in the future.   These are the goals we discussed:  Goals      DIET - INCREASE WATER INTAKE        This is a list of the screening recommended for you and due dates:  Health Maintenance  Topic Date Due   COVID-19 Vaccine (1) Never done   Screening for Lung Cancer  Never done   Zoster (Shingles) Vaccine (1 of 2) Never done   Flu Shot  Never done   Colon Cancer Screening  02/20/2023*   Medicare Annual Wellness Visit  08/13/2023   Hepatitis C Screening: USPSTF Recommendation to screen - Ages 18-79 yo.  Completed   HIV Screening  Completed   HPV Vaccine  Aged Out  *Topic was postponed. The date shown is not the original due date.    Advanced directives: Advance directive discussed with you today. I have provided a copy for you to complete at home and have notarized. Once this is complete please bring a copy in to our office so we can scan it into your chart.   Conditions/risks identified: Aim for 30 minutes of exercise or brisk walking, 6-8 glasses of water, and 5 servings of fruits and vegetables each day.   Next appointment: Follow up in one year for your annual wellness visit   Preventive Care 40-64 Years, Male Preventive care refers to lifestyle choices and visits with your health care provider that can promote health and wellness. What does preventive care include? A yearly physical exam. This is also called an annual well check. Dental exams once or twice a year. Routine eye exams. Ask your health care provider how often you should have your eyes checked. Personal lifestyle choices, including: Daily care of your teeth and gums. Regular physical activity. Eating a healthy diet. Avoiding tobacco and drug use. Limiting alcohol use. Practicing safe  sex. Taking low-dose aspirin every day starting at age 61. What happens during an annual well check? The services and screenings done by your health care provider during your annual well check will depend on your age, overall health, lifestyle risk factors, and family history of disease. Counseling  Your health care provider may ask you questions about your: Alcohol use. Tobacco use. Drug use. Emotional well-being. Home and relationship well-being. Sexual activity. Eating habits. Work and work Astronomer. Screening  You may have the following tests or measurements: Height, weight, and BMI. Blood pressure. Lipid and cholesterol levels. These may be checked every 5 years, or more frequently if you are over 41 years old. Skin check. Lung cancer screening. You may have this screening every year starting at age 69 if you have a 30-pack-year history of smoking and currently smoke or have quit within the past 15 years. Fecal occult blood test (FOBT) of the stool. You may have this test every year starting at age 29. Flexible sigmoidoscopy or colonoscopy. You may have a sigmoidoscopy every 5 years or a colonoscopy every 10 years starting at age 58. Prostate cancer screening. Recommendations will vary depending on your family history and other risks. Hepatitis C blood test. Hepatitis B blood test. Sexually transmitted disease (STD) testing. Diabetes screening. This is done by checking your blood sugar (glucose) after you have not eaten for a while (fasting). You may  have this done every 1-3 years. Discuss your test results, treatment options, and if necessary, the need for more tests with your health care provider. Vaccines  Your health care provider may recommend certain vaccines, such as: Influenza vaccine. This is recommended every year. Tetanus, diphtheria, and acellular pertussis (Tdap, Td) vaccine. You may need a Td booster every 10 years. Zoster vaccine. You may need this after age  59. Pneumococcal 13-valent conjugate (PCV13) vaccine. You may need this if you have certain conditions and have not been vaccinated. Pneumococcal polysaccharide (PPSV23) vaccine. You may need one or two doses if you smoke cigarettes or if you have certain conditions. Talk to your health care provider about which screenings and vaccines you need and how often you need them. This information is not intended to replace advice given to you by your health care provider. Make sure you discuss any questions you have with your health care provider. Document Released: 09/29/2015 Document Revised: 05/22/2016 Document Reviewed: 07/04/2015 Elsevier Interactive Patient Education  2017 [place] Prevention in the Home Falls can cause injuries. They can happen to people of all ages. There are many things you can do to make your home safe and to help prevent falls. What can I do on the outside of my home? Regularly fix the edges of walkways and driveways and fix any cracks. Remove anything that might make you trip as you walk through a door, such as a raised step or threshold. Trim any bushes or trees on the path to your home. Use bright outdoor lighting. Clear any walking paths of anything that might make someone trip, such as rocks or tools. Regularly check to see if handrails are loose or broken. Make sure that both sides of any steps have handrails. Any raised decks and porches should have guardrails on the edges. Have any leaves, snow, or ice cleared regularly. Use sand or salt on walking paths during winter. Clean up any spills in your garage right away. This includes oil or grease spills. What can I do in the bathroom? Use night lights. Install grab bars by the toilet and in the tub and shower. Do not use towel bars as grab bars. Use non-skid mats or decals in the tub or shower. If you need to sit down in the shower, use a plastic, non-slip stool. Keep the floor dry. Clean up any water  that spills on the floor as soon as it happens. Remove soap buildup in the tub or shower regularly. Attach bath mats securely with double-sided non-slip rug tape. Do not have throw rugs and other things on the floor that can make you trip. What can I do in the bedroom? Use night lights. Make sure that you have a light by your bed that is easy to reach. Do not use any sheets or blankets that are too big for your bed. They should not hang down onto the floor. Have a firm chair that has side arms. You can use this for support while you get dressed. Do not have throw rugs and other things on the floor that can make you trip. What can I do in the kitchen? Clean up any spills right away. Avoid walking on wet floors. Keep items that you use a lot in easy-to-reach places. If you need to reach something above you, use a strong step stool that has a grab bar. Keep electrical cords out of the way. Do not use floor polish or wax that makes floors slippery. If  you must use wax, use non-skid floor wax. Do not have throw rugs and other things on the floor that can make you trip. What can I do with my stairs? Do not leave any items on the stairs. Make sure that there are handrails on both sides of the stairs and use them. Fix handrails that are broken or loose. Make sure that handrails are as long as the stairways. Check any carpeting to make sure that it is firmly attached to the stairs. Fix any carpet that is loose or worn. Avoid having throw rugs at the top or bottom of the stairs. If you do have throw rugs, attach them to the floor with carpet tape. Make sure that you have a light switch at the top of the stairs and the bottom of the stairs. If you do not have them, ask someone to add them for you. What else can I do to help prevent falls? Wear shoes that: Do not have high heels. Have rubber bottoms. Are comfortable and fit you well. Are closed at the toe. Do not wear sandals. If you use a  stepladder: Make sure that it is fully opened. Do not climb a closed stepladder. Make sure that both sides of the stepladder are locked into place. Ask someone to hold it for you, if possible. Clearly mark and make sure that you can see: Any grab bars or handrails. First and last steps. Where the edge of each step is. Use tools that help you move around (mobility aids) if they are needed. These include: Canes. Walkers. Scooters. Crutches. Turn on the lights when you go into a dark area. Replace any light bulbs as soon as they burn out. Set up your furniture so you have a clear path. Avoid moving your furniture around. If any of your floors are uneven, fix them. If there are any pets around you, be aware of where they are. Review your medicines with your doctor. Some medicines can make you feel dizzy. This can increase your chance of falling. Ask your doctor what other things that you can do to help prevent falls. This information is not intended to replace advice given to you by your health care provider. Make sure you discuss any questions you have with your health care provider. Document Released: 06/29/2009 Document Revised: 02/08/2016 Document Reviewed: 10/07/2014 Elsevier Interactive Patient Education  2017 [employer].

## 2022-08-12 NOTE — Telephone Encounter (Signed)
Patient would like to see a different PCP. He does not have a preference. Please call back to let him know.

## 2022-08-12 NOTE — Progress Notes (Signed)
Subjective:   Spencer Brown is a 51 y.o. male who presents for Medicare Annual/Subsequent preventive examination.I connected with  [name] on 08/12/22 by a audio enabled telemedicine application and verified that I am speaking with the correct person using two identifiers.  Patient Location: Home  Provider Location: Home Office  I discussed the limitations of evaluation and management by telemedicine. The patient expressed understanding and agreed to proceed.  Review of Systems     Cardiac Risk Factors include: advanced age (>8men, >48 women);male gender     Objective:    Today's Vitals   08/12/22 1009  Weight: 145 lb (65.8 kg)  Height: 6\' 1"  (1.854 m)   Body mass index is 19.13 kg/m.     08/12/2022   10:14 AM 03/04/2020    7:26 AM 03/03/2020    8:53 PM  Advanced Directives  Does Patient Have a Medical Advance Directive? No No No  Would patient like information on creating a medical advance directive? No - Patient declined      Current Medications (verified) Outpatient Encounter Medications as of 08/12/2022  Medication Sig   Multiple Vitamins-Minerals (MULTIVITAMIN WITH MINERALS) tablet Take 1 tablet by mouth daily.   ibuprofen (ADVIL) 600 MG tablet Take 1 tablet (600 mg total) by mouth every 8 (eight) hours as needed. (Patient not taking: Reported on 08/12/2022)   No facility-administered encounter medications on file as of 08/12/2022.    Allergies (verified) Haloperidol, Bee pollen, and Haldol [haloperidol lactate]   History: History reviewed. No pertinent past medical history. Past Surgical History:  Procedure Laterality Date   HERNIA REPAIR     Family History  Problem Relation Age of Onset   Cancer Maternal Grandmother    Cancer Maternal Grandfather    COPD Paternal Grandmother    Cancer Paternal Grandmother    Social History   Socioeconomic History   Marital status: Single    Spouse name: Not on file   Number of children: Not on  file   Years of education: Not on file   Highest education level: Not on file  Occupational History   Not on file  Tobacco Use   Smoking status: Every Day    Packs/day: 0.50    Years: 41.00    Total pack years: 20.50    Types: Cigarettes   Smokeless tobacco: Never  Vaping Use   Vaping Use: Never used  Substance and Sexual Activity   Alcohol use: Not Currently    Comment: stopped 8 years ago   Drug use: Yes    Types: Marijuana   Sexual activity: Yes    Birth control/protection: None  Other Topics Concern   Not on file  Social History Narrative   Not on file   Social Determinants of Health   Financial Resource Strain: Low Risk  (08/12/2022)   Overall Financial Resource Strain (CARDIA)    Difficulty of Paying Living Expenses: Not hard at all  Food Insecurity: No Food Insecurity (08/12/2022)   Hunger Vital Sign    Worried About Running Out of Food in the Last Year: Never true    Ran Out of Food in the Last Year: Never true  Transportation Needs: No Transportation Needs (08/12/2022)   PRAPARE - 08/14/2022 (Medical): No    Lack of Transportation (Non-Medical): No  Physical Activity: Insufficiently Active (08/12/2022)   Exercise Vital Sign    Days of Exercise per Week: 3 days    Minutes of Exercise  per Session: 30 min  Stress: No Stress Concern Present (08/12/2022)   [employer] of Occupational Health - Occupational Stress Questionnaire    Feeling of Stress : Not at all  Social Connections: Moderately Isolated (08/12/2022)   Social Connection and Isolation Panel [NHANES]    Frequency of Communication with Friends and Family: Twice a week    Frequency of Social Gatherings with Friends and Family: Twice a week    Attends Religious Services: 1 to 4 times per year    Active Member of [employer] or Organizations: No    Attends Engineer, structural: Never    Marital Status: Never married    Tobacco Counseling Ready to quit: Not  Answered Counseling given: Not Answered   Clinical Intake:  Pre-visit preparation completed: Yes  Pain : No/denies pain     Nutritional Risks: None Diabetes: No  How often do you need to have someone help you when you read instructions, pamphlets, or other written materials from your doctor or pharmacy?: 1 - Never  Diabetic?no  Interpreter Needed?: No  Information entered by :: Renie Ora, LPN   Activities of Daily Living    08/12/2022   10:15 AM  In your present state of health, do you have any difficulty performing the following activities:  Hearing? 0  Vision? 0  Difficulty concentrating or making decisions? 0  Walking or climbing stairs? 0  Dressing or bathing? 0  Doing errands, shopping? 0  Preparing Food and eating ? N  Using the Toilet? N  In the past six months, have you accidently leaked urine? N  Do you have problems with loss of bowel control? N  Managing your Medications? N  Managing your Finances? N  Housekeeping or managing your Housekeeping? N    Patient Care Team: Daryll Drown, NP as PCP - General (Nurse Practitioner)  Indicate any recent Medical Services you may have received from other than [facility] providers in the past year (date may be approximate).     Assessment:   This is a routine wellness examination for Spencer Brown.  Hearing/Vision screen Vision Screening - Comments:: Declined at this time   Dietary issues and exercise activities discussed: Current Exercise Habits: Home exercise routine, Type of exercise: walking, Time (Minutes): 30, Frequency (Times/Week): 3, Weekly Exercise (Minutes/Week): 90, Intensity: Mild, Exercise limited by: None identified   Goals Addressed             This Visit's Progress    DIET - INCREASE WATER INTAKE         Depression Screen    08/12/2022   10:12 AM 03/28/2022    8:11 AM 02/19/2022    8:27 AM  PHQ 2/9 Scores  PHQ - 2 Score 0 1 1  PHQ- 9 Score  5 3    Fall Risk    08/12/2022   10:10  AM 02/19/2022    8:26 AM  Fall Risk   Falls in the past year? 0 1  Number falls in past yr: 0 1  Injury with Fall? 0 1  Risk for fall due to : No Fall Risks History of fall(s)  Follow up Falls prevention discussed     FALL RISK PREVENTION PERTAINING TO THE HOME:  Any stairs in or around the home? No  If so, are there any without handrails? No  Home free of loose throw rugs in walkways, pet beds, electrical cords, etc? Yes  Adequate lighting in your home to reduce risk of falls? Yes  ASSISTIVE DEVICES UTILIZED TO PREVENT FALLS:  Life alert? No  Use of a cane, walker or w/c? No  Grab bars in the bathroom? No  Shower chair or bench in shower? No  Elevated toilet seat or a handicapped toilet? No          08/12/2022   10:16 AM  6CIT Screen  What Year? 0 points  What month? 0 points  What time? 0 points  Count back from 20 0 points  Months in reverse 0 points  Repeat phrase 0 points  Total Score 0 points    Immunizations  There is no immunization history on file for this patient.  TDAP status: Due, Education has been provided regarding the importance of this vaccine. Advised may receive this vaccine at local pharmacy or Health Dept. Aware to provide a copy of the vaccination record if obtained from local pharmacy or Health Dept. Verbalized acceptance and understanding.  Flu Vaccine status: Due, Education has been provided regarding the importance of this vaccine. Advised may receive this vaccine at local pharmacy or Health Dept. Aware to provide a copy of the vaccination record if obtained from local pharmacy or Health Dept. Verbalized acceptance and understanding.  Pneumococcal vaccine status: Due, Education has been provided regarding the importance of this vaccine. Advised may receive this vaccine at local pharmacy or Health Dept. Aware to provide a copy of the vaccination record if obtained from local pharmacy or Health Dept. Verbalized acceptance and  understanding.  Covid-19 vaccine status: Declined, Education has been provided regarding the importance of this vaccine but patient still declined. Advised may receive this vaccine at local pharmacy or Health Dept.or vaccine clinic. Aware to provide a copy of the vaccination record if obtained from local pharmacy or Health Dept. Verbalized acceptance and understanding.  Qualifies for Shingles Vaccine? Yes   Zostavax completed No   Shingrix Completed?: No.    Education has been provided regarding the importance of this vaccine. Patient has been advised to call insurance company to determine out of pocket expense if they have not yet received this vaccine. Advised may also receive vaccine at local pharmacy or Health Dept. Verbalized acceptance and understanding.  Screening Tests Health Maintenance  Topic Date Due   COVID-19 Vaccine (1) Never done   Lung Cancer Screening  Never done   Zoster Vaccines- Shingrix (1 of 2) Never done   INFLUENZA VACCINE  Never done   COLONOSCOPY (Pts 45-1824yrs Insurance coverage will need to be confirmed)  02/20/2023 (Originally 02/19/2017)   Medicare Annual Wellness (AWV)  08/13/2023   Hepatitis C Screening  Completed   HIV Screening  Completed   HPV VACCINES  Aged Out    Health Maintenance  Health Maintenance Due  Topic Date Due   COVID-19 Vaccine (1) Never done   Lung Cancer Screening  Never done   Zoster Vaccines- Shingrix (1 of 2) Never done   INFLUENZA VACCINE  Never done    Colorectal cancer screening: Referral to GI placed declined at this time . Pt aware the office will call re: appt.  Lung Cancer Screening: (Low Dose CT Chest recommended if Age 24-80 years, 30 pack-year currently smoking OR have quit w/in 15years.) does qualify.   Lung Cancer Screening Referral: patient declined   Additional Screening:  Hepatitis C Screening: does qualify; Declined at this time   Vision Screening: Recommended annual ophthalmology exams for early detection of  glaucoma and other disorders of the eye. Is the patient up to date with their  annual eye exam?  No  Who is the provider or what is the name of the office in which the patient attends annual eye exams? Patient declines  If pt is not established with a provider, would they like to be referred to a provider to establish care? No .   Dental Screening: Recommended annual dental exams for proper oral hygiene  Community Resource Referral / Chronic Care Management: CRR required this visit?  No   CCM required this visit?  No      Plan:     I have personally reviewed and noted the following in the patient's chart:   Medical and social history Use of alcohol, tobacco or illicit drugs  Current medications and supplements including opioid prescriptions. Patient is not currently taking opioid prescriptions. Functional ability and status Nutritional status Physical activity Advanced directives List of other physicians Hospitalizations, surgeries, and ER visits in previous 12 months Vitals Screenings to include cognitive, depression, and falls Referrals and appointments  In addition, I have reviewed and discussed with patient certain preventive protocols, quality metrics, and best practice recommendations. A written personalized care plan for preventive services as well as general preventive health recommendations were provided to patient.     Lorrene Reid, LPN   [id number]/06/[id number]   Nurse Notes: Patient declines Eye Doctor referral , any vaccines , Referral Colonoscopy declined

## 2022-08-12 NOTE — Telephone Encounter (Signed)
Attempted to call pt , no answer - pt can switch but we would need to know which provider and get confirmation form them and [facility]

## 2022-10-03 ENCOUNTER — Ambulatory Visit (INDEPENDENT_AMBULATORY_CARE_PROVIDER_SITE_OTHER): Payer: 59 | Admitting: Family Medicine

## 2022-10-03 ENCOUNTER — Encounter: Payer: Self-pay | Admitting: Family Medicine

## 2022-10-03 VITALS — BP 120/61 | HR 81 | Temp 97.8°F | Ht 73.0 in | Wt 154.5 lb

## 2022-10-03 DIAGNOSIS — Z789 Other specified health status: Secondary | ICD-10-CM | POA: Diagnosis not present

## 2022-10-03 DIAGNOSIS — Z122 Encounter for screening for malignant neoplasm of respiratory organs: Secondary | ICD-10-CM | POA: Diagnosis not present

## 2022-10-03 DIAGNOSIS — Z720 Tobacco use: Secondary | ICD-10-CM | POA: Diagnosis not present

## 2022-10-03 NOTE — Patient Instructions (Signed)

## 2022-10-03 NOTE — Progress Notes (Signed)
   Established Patient Office Visit  Subjective   Patient ID: Spencer Brown, male    DOB: [birth year]  Age: 52 y.o. MRN: [id number]  Chief Complaint  Patient presents with   Nicotine Dependence    HPI Spencer Brown is here to establish care with a new provider. He had a CPE with labs earlier this year. He has no concerns today. He is a current smoker. He is not ready to quit. He has smoked a 1/2 PPD for 35-40 years. He is interested in lung cancer screening.      ROS As per HPI.    Objective:     BP 120/61   Pulse 81   Temp 97.8 F (36.6 C) (Temporal)   Ht 6\' 1"  (1.854 m)   Wt 154 lb 8 oz (70.1 kg)   SpO2 99%   BMI 20.38 kg/m    Physical Exam Vitals and nursing note reviewed.  Constitutional:      General: He is not in acute distress.    Appearance: He is not ill-appearing, toxic-appearing or diaphoretic.  Cardiovascular:     Rate and Rhythm: Normal rate and regular rhythm.     Heart sounds: Normal heart sounds. No murmur heard. Pulmonary:     Effort: Pulmonary effort is normal. No respiratory distress.     Breath sounds: Normal breath sounds. No wheezing.  Musculoskeletal:     Cervical back: No rigidity.     Right lower leg: No edema.     Left lower leg: No edema.  Skin:    General: Skin is warm and dry.  Neurological:     General: No focal deficit present.     Mental Status: He is alert and oriented to person, place, and time.  Psychiatric:        Mood and Affect: Mood normal.        Behavior: Behavior normal.    No results found for any visits on 10/03/22.    The 10-year ASCVD risk score (Arnett DK, et al., 2019) is: 6.8%    Assessment & Plan:   Om was seen today for nicotine dependence.  Diagnoses and all orders for this visit:  Tobacco use Cessation handout given.   Screening for lung cancer Lung cancer screening discussed and ordered.  -     CT CHEST LUNG CA SCREEN LOW DOSE W/O CM; Future  Health maintenance alteration He declined  colon cancer screening, flu vaccine, and shingrix today.   Return in about 20 weeks (around 02/20/2023) for CPE.   The patient indicates understanding of these issues and agrees with the plan.  Gwenlyn Perking, FNP

## 2023-01-20 ENCOUNTER — Encounter: Payer: Self-pay | Admitting: *Deleted

## 2023-02-20 ENCOUNTER — Encounter: Payer: Self-pay | Admitting: Family Medicine

## 2023-02-20 ENCOUNTER — Ambulatory Visit (INDEPENDENT_AMBULATORY_CARE_PROVIDER_SITE_OTHER): Payer: 59 | Admitting: Family Medicine

## 2023-02-20 VITALS — BP 100/72 | HR 77 | Temp 97.7°F | Ht 73.0 in | Wt 141.0 lb

## 2023-02-20 DIAGNOSIS — Z0000 Encounter for general adult medical examination without abnormal findings: Secondary | ICD-10-CM

## 2023-02-20 DIAGNOSIS — M25511 Pain in right shoulder: Secondary | ICD-10-CM

## 2023-02-20 DIAGNOSIS — G8929 Other chronic pain: Secondary | ICD-10-CM | POA: Diagnosis not present

## 2023-02-20 DIAGNOSIS — Z0001 Encounter for general adult medical examination with abnormal findings: Secondary | ICD-10-CM | POA: Diagnosis not present

## 2023-02-20 NOTE — Progress Notes (Signed)
Complete physical exam  Patient: Spencer Brown   DOB: 24-Sep-1970   52 y.o. Male  MRN: [id number]  Subjective:    Chief Complaint  Patient presents with   Follow-up    CPE    Spencer Brown is a 52 y.o. male who presents today for a complete physical exam. He reports consuming a general diet. The patient has a physically strenuous job, but has no regular exercise apart from work.  He generally feels well. He reports sleeping well. He does have additional problems to discuss today.   He would like a referral to orthopedic for his right shoulder. This has been bothering him for over 20 years after an accident. He did not have any evaluation after the accident. He reports daily pain that is worse at night. He has a lot of crepitus and does have limited ROM and strength of this shoulder, especially with overhead movement. He had an xray last year that should degenerative changes.   Most recent fall risk assessment:    10/03/2022    8:40 AM  Fall Risk   Falls in the past year? 1  Number falls in past yr: 1  Injury with Fall? 1  Risk for fall due to : History of fall(s)  Follow up Falls evaluation completed     Most recent depression screenings:    10/03/2022    8:40 AM 08/12/2022   10:12 AM  PHQ 2/9 Scores  PHQ - 2 Score 0 0  PHQ- 9 Score 1     Vision:Within last year and Dental: No current dental problems and Receives regular dental care    Patient Care Team: Gabriel Earing, FNP as PCP - General (Family Medicine)   Outpatient Medications Prior to Visit  Medication Sig   Multiple Vitamins-Minerals (MULTIVITAMIN WITH MINERALS) tablet Take 1 tablet by mouth daily.   No facility-administered medications prior to visit.    ROS Negative unless specially indicated above in HPI.     Objective:     BP 100/72   Pulse 77   Temp 97.7 F (36.5 C) (Oral)   Ht 6\' 1"  (1.854 m)   Wt 141 lb (64 kg)   SpO2 99%   BMI 18.60 kg/m    Physical Exam Vitals and  nursing note reviewed.  Constitutional:      General: He is not in acute distress.    Appearance: He is not ill-appearing.  HENT:     Head: Normocephalic.     Right Ear: Tympanic membrane, ear canal and external ear normal.     Left Ear: Tympanic membrane, ear canal and external ear normal.     Nose: Nose normal.     Mouth/Throat:     Mouth: Mucous membranes are moist.     Pharynx: Oropharynx is clear.  Eyes:     Extraocular Movements: Extraocular movements intact.     Conjunctiva/sclera: Conjunctivae normal.     Pupils: Pupils are equal, round, and reactive to light.  Neck:     Thyroid: No thyroid mass, thyromegaly or thyroid tenderness.  Cardiovascular:     Rate and Rhythm: Normal rate and regular rhythm.     Pulses: Normal pulses.     Heart sounds: Normal heart sounds. No murmur heard.    No friction rub. No gallop.  Pulmonary:     Effort: Pulmonary effort is normal.     Breath sounds: Normal breath sounds.  Abdominal:     General: Bowel sounds are normal.  There is no distension.     Palpations: Abdomen is soft. There is no mass.     Tenderness: There is no abdominal tenderness. There is no guarding.  Musculoskeletal:        General: No swelling. Normal range of motion.     Right shoulder: Tenderness and crepitus present. No swelling, effusion or bony tenderness. Decreased strength.     Cervical back: Neck supple. No tenderness.     Comments: Negative empty can test.   Skin:    General: Skin is warm and dry.     Capillary Refill: Capillary refill takes less than 2 seconds.     Findings: No lesion or rash.  Neurological:     General: No focal deficit present.     Mental Status: He is alert and oriented to person, place, and time.  Psychiatric:        Mood and Affect: Mood normal.        Behavior: Behavior normal.        Thought Content: Thought content normal.      No results found for any visits on 02/20/23.     Assessment & Plan:    Routine Health Maintenance  and Physical Exam  Spencer Brown was seen today for annual exam.  Diagnoses and all orders for this visit:  Routine general medical examination at a health care facility Declined labs today.   Chronic right shoulder pain RICE therapy, tylenol/advil prn for pain. Referral to ortho for further evaluation.  -     Ambulatory referral to Orthopedic Surgery  Other orders -     Cancel: CBC with Differential/Platelet -     Cancel: CMP14+EGFR -     Cancel: Lipid panel -     Cancel: TSH    Immunization History  Administered Date(s) Administered   Tdap 09/16/2010, 10/26/2020    Health Maintenance  Topic Date Due   Lung Cancer Screening  Never done   Colonoscopy  02/20/2023 (Originally 02/19/2017)   COVID-19 Vaccine (1) 10/20/2023 (Originally 08/21/1971)   Zoster Vaccines- Shingrix (1 of 2) 01/02/2024 (Originally 02/19/2022)   INFLUENZA VACCINE  04/17/2023   Medicare Annual Wellness (AWV)  08/13/2023   DTaP/Tdap/Td (3 - Td or Tdap) 10/26/2030   Hepatitis C Screening  Completed   HIV Screening  Completed   HPV VACCINES  Aged Out    Discussed health benefits of physical activity, and encouraged him to engage in regular exercise appropriate for his age and condition.  Problem List Items Addressed This Visit       Other   Chronic right shoulder pain   Relevant Orders   Ambulatory referral to Orthopedic Surgery   Other Visit Diagnoses     Routine general medical examination at a health care facility    -  Primary      Return in 1 year (on 02/20/2024).   The patient indicates understanding of these issues and agrees with the plan.  Gabriel Earing, FNP

## 2023-02-20 NOTE — Patient Instructions (Signed)
Health Maintenance, Male Adopting a healthy lifestyle and getting preventive care are important in promoting health and wellness. Ask your health care provider about: The right schedule for you to have regular tests and exams. Things you can do on your own to prevent diseases and keep yourself healthy. What should I know about diet, weight, and exercise? Eat a healthy diet  Eat a diet that includes plenty of vegetables, fruits, low-fat dairy products, and lean protein. Do not eat a lot of foods that are high in solid fats, added sugars, or sodium. Maintain a healthy weight Body mass index (BMI) is a measurement that can be used to identify possible weight problems. It estimates body fat based on height and weight. Your health care provider can help determine your BMI and help you achieve or maintain a healthy weight. Get regular exercise Get regular exercise. This is one of the most important things you can do for your health. Most adults should: Exercise for at least 150 minutes each week. The exercise should increase your heart rate and make you sweat (moderate-intensity exercise). Do strengthening exercises at least twice a week. This is in addition to the moderate-intensity exercise. Spend less time sitting. Even light physical activity can be beneficial. Watch cholesterol and blood lipids Have your blood tested for lipids and cholesterol at 51 years of age, then have this test every 5 years. You may need to have your cholesterol levels checked more often if: Your lipid or cholesterol levels are high. You are older than 51 years of age. You are at high risk for heart disease. What should I know about cancer screening? Many types of cancers can be detected early and may often be prevented. Depending on your health history and family history, you may need to have cancer screening at various ages. This may include screening for: Colorectal cancer. Prostate cancer. Skin cancer. Lung  cancer. What should I know about heart disease, diabetes, and high blood pressure? Blood pressure and heart disease High blood pressure causes heart disease and increases the risk of stroke. This is more likely to develop in people who have high blood pressure readings or are overweight. Talk with your health care provider about your target blood pressure readings. Have your blood pressure checked: Every 3-5 years if you are 18-39 years of age. Every year if you are 40 years old or older. If you are between the ages of 65 and 75 and are a current or former smoker, ask your health care provider if you should have a one-time screening for abdominal aortic aneurysm (AAA). Diabetes Have regular diabetes screenings. This checks your fasting blood sugar level. Have the screening done: Once every three years after age 45 if you are at a normal weight and have a low risk for diabetes. More often and at a younger age if you are overweight or have a high risk for diabetes. What should I know about preventing infection? Hepatitis B If you have a higher risk for hepatitis B, you should be screened for this virus. Talk with your health care provider to find out if you are at risk for hepatitis B infection. Hepatitis C Blood testing is recommended for: Everyone born from 1945 through 1965. Anyone with known risk factors for hepatitis C. Sexually transmitted infections (STIs) You should be screened each year for STIs, including gonorrhea and chlamydia, if: You are sexually active and are younger than 51 years of age. You are older than 51 years of age and your   health care provider tells you that you are at risk for this type of infection. Your sexual activity has changed since you were last screened, and you are at increased risk for chlamydia or gonorrhea. Ask your health care provider if you are at risk. Ask your health care provider about whether you are at high risk for HIV. Your health care provider  may recommend a prescription medicine to help prevent HIV infection. If you choose to take medicine to prevent HIV, you should first get tested for HIV. You should then be tested every 3 months for as long as you are taking the medicine. Follow these instructions at home: Alcohol use Do not drink alcohol if your health care provider tells you not to drink. If you drink alcohol: Limit how much you have to 0-2 drinks a day. Know how much alcohol is in your drink. In the [place] one drink equals one 12 oz bottle of beer (355 mL), one 5 oz glass of wine (148 mL), or one 1 oz glass of hard liquor (44 mL). Lifestyle Do not use any products that contain nicotine or tobacco. These products include cigarettes, chewing tobacco, and vaping devices, such as e-cigarettes. If you need help quitting, ask your health care provider. Do not use street drugs. Do not share needles. Ask your health care provider for help if you need support or information about quitting drugs. General instructions Schedule regular health, dental, and eye exams. Stay current with your vaccines. Tell your health care provider if: You often feel depressed. You have ever been abused or do not feel safe at home. Summary Adopting a healthy lifestyle and getting preventive care are important in promoting health and wellness. Follow your health care provider's instructions about healthy diet, exercising, and getting tested or screened for diseases. Follow your health care provider's instructions on monitoring your cholesterol and blood pressure. This information is not intended to replace advice given to you by your health care provider. Make sure you discuss any questions you have with your health care provider. Document Revised: 01/22/2021 Document Reviewed: 01/22/2021 Elsevier Patient Education  2024 [employer]vier Inc.  

## 2023-05-08 ENCOUNTER — Encounter: Payer: Self-pay | Admitting: Nurse Practitioner

## 2023-05-08 ENCOUNTER — Ambulatory Visit: Payer: 59 | Admitting: Nurse Practitioner

## 2023-05-08 ENCOUNTER — Ambulatory Visit (INDEPENDENT_AMBULATORY_CARE_PROVIDER_SITE_OTHER): Payer: 59 | Admitting: Nurse Practitioner

## 2023-05-08 ENCOUNTER — Telehealth: Payer: Self-pay | Admitting: Family Medicine

## 2023-05-08 VITALS — BP 116/61 | HR 73 | Temp 98.4°F | Resp 20 | Ht 73.0 in | Wt 142.0 lb

## 2023-05-08 DIAGNOSIS — M25572 Pain in left ankle and joints of left foot: Secondary | ICD-10-CM

## 2023-05-08 NOTE — Progress Notes (Signed)
   Subjective:    Patient ID: Spencer Brown, male    DOB: 1971-03-20, 52 y.o.   MRN: [id number]   Chief Complaint: Left lower leg pain   Leg Pain  The incident occurred more than 1 week ago. Injury mechanism: unsure of injury. The pain is present in the left leg. The quality of the pain is described as shooting. The pain is at a severity of 7/10. The pain is severe. The pain has been Fluctuating since onset. Pertinent negatives include no inability to bear weight, loss of motion, loss of sensation, muscle weakness, numbness or tingling. He reports no foreign bodies present. Nothing aggravates the symptoms. He has tried NSAIDs for the symptoms. The treatment provided no relief.      Patient Active Problem List   Diagnosis Date Noted   Encounter for annual physical exam 02/19/2022   Encounter to establish care with new doctor 02/19/2022   Chronic right shoulder pain 02/19/2022       Review of Systems  Constitutional:  Negative for diaphoresis.  Eyes:  Negative for pain.  Respiratory:  Negative for shortness of breath.   Cardiovascular:  Negative for chest pain, palpitations and leg swelling.  Gastrointestinal:  Negative for abdominal pain.  Endocrine: Negative for polydipsia.  Musculoskeletal:  Positive for myalgias (left lower leg).  Skin:  Negative for rash.  Neurological:  Negative for dizziness, tingling, weakness, numbness and headaches.  Hematological:  Does not bruise/bleed easily.  All other systems reviewed and are negative.      Objective:   Physical Exam Constitutional:      Appearance: Normal appearance.  Cardiovascular:     Rate and Rhythm: Normal rate and regular rhythm.     Heart sounds: Normal heart sounds.  Pulmonary:     Effort: Pulmonary effort is normal.     Breath sounds: Normal breath sounds.  Musculoskeletal:     Comments: Muscular pain on palpation lateral side of left leg  Skin:    General: Skin is warm.  Neurological:     General: No  focal deficit present.     Mental Status: He is alert and oriented to person, place, and time.  Psychiatric:        Mood and Affect: Mood normal.        Behavior: Behavior normal.    BP 116/61   Pulse 73   Temp 98.4 F (36.9 C) (Temporal)   Resp 20   Ht 6\' 1"  (1.854 m)   Wt 142 lb (64.4 kg)   SpO2 99%   BMI 18.73 kg/m         Assessment & Plan:  Spencer Brown in today with chief complaint of Left lower leg pain   1. Acute left ankle pain  Patient walked out because is said that did not think it was a fracture because he would not be able to walk on it if he had a fracture. Patient got very upset and said you are not listening to me.  " Hurst all the time but is worse when I lay down " . I was looking for order for xray in computer and he started yelling and said  " I dont want to listen  to your mouth" as he got up and lwalk out of exam room.

## 2023-05-08 NOTE — Telephone Encounter (Signed)
Called patient to schedule him with his PCP Spencer Brown) for his left leg pain.  VM was full.  If he calls back, please schedule him with first available with Spencer Brown.  Thank you!

## 2023-05-08 NOTE — Telephone Encounter (Signed)
Patient returned call and declined first available appointment with Tiffany.

## 2023-05-15 ENCOUNTER — Ambulatory Visit: Payer: 59 | Admitting: Family Medicine

## 2023-06-20 ENCOUNTER — Other Ambulatory Visit: Payer: Self-pay | Admitting: Family Medicine

## 2023-06-20 DIAGNOSIS — Z1212 Encounter for screening for malignant neoplasm of rectum: Secondary | ICD-10-CM

## 2023-06-20 DIAGNOSIS — Z1211 Encounter for screening for malignant neoplasm of colon: Secondary | ICD-10-CM

## 2023-09-04 DIAGNOSIS — Z299 Encounter for prophylactic measures, unspecified: Secondary | ICD-10-CM | POA: Diagnosis not present

## 2023-09-04 DIAGNOSIS — M1990 Unspecified osteoarthritis, unspecified site: Secondary | ICD-10-CM | POA: Diagnosis not present

## 2023-09-04 DIAGNOSIS — Z0000 Encounter for general adult medical examination without abnormal findings: Secondary | ICD-10-CM | POA: Diagnosis not present

## 2023-09-04 DIAGNOSIS — Z7189 Other specified counseling: Secondary | ICD-10-CM | POA: Diagnosis not present

## 2023-09-04 DIAGNOSIS — E7800 Pure hypercholesterolemia, unspecified: Secondary | ICD-10-CM | POA: Diagnosis not present

## 2023-09-04 DIAGNOSIS — M25519 Pain in unspecified shoulder: Secondary | ICD-10-CM | POA: Diagnosis not present

## 2023-09-05 DIAGNOSIS — E7800 Pure hypercholesterolemia, unspecified: Secondary | ICD-10-CM | POA: Diagnosis not present

## 2023-09-05 DIAGNOSIS — Z0000 Encounter for general adult medical examination without abnormal findings: Secondary | ICD-10-CM | POA: Diagnosis not present

## 2023-10-01 ENCOUNTER — Ambulatory Visit: Payer: 59 | Admitting: Orthopaedic Surgery

## 2023-10-01 DIAGNOSIS — M79642 Pain in left hand: Secondary | ICD-10-CM | POA: Insufficient documentation

## 2023-10-01 NOTE — Progress Notes (Signed)
 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  He bites his fingernails past the quick and uses on knife to try and remove any Sprint splinters.  Patient uses superglue duct tape and then uses his work.  Couple areas of small splinters are not found fingertips but they are not symptomatic.  Areas where he has trouble with send the nail plate which is 16% gone and nailbed where he nods on his fingernails.  States when he was younger he was on some medicine for anxiety.  We discussed trying to remove 2 visible splinters adjacent to left hand thumb nail plate patient left before this could be done.

## 2023-11-01 IMAGING — DX DG SHOULDER 2+V*R*
3 series · 3 of 3 positions shown · non-contrast
Comparison: None Available.

CLINICAL DATA: Chronic shoulder pain

EXAM:
RIGHT SHOULDER - 2+ VIEW

[shoulder ap]
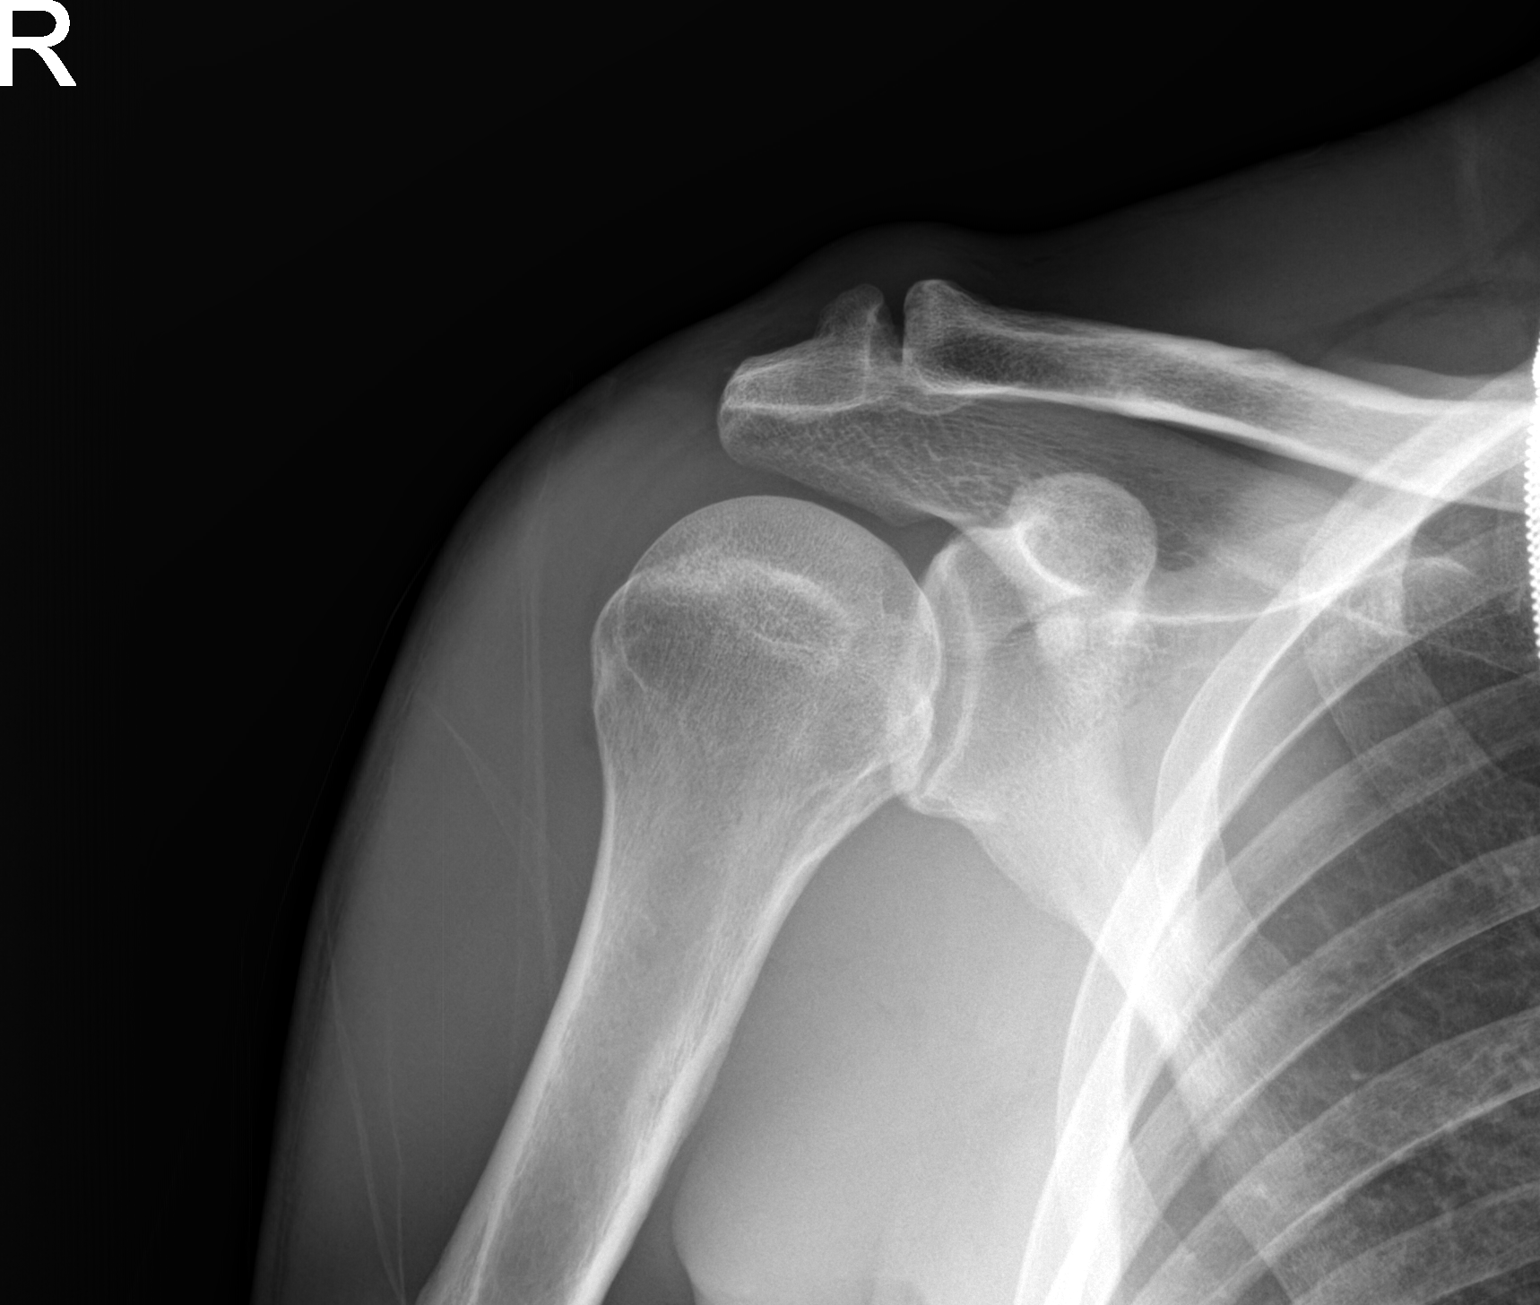

[shoulder obl]
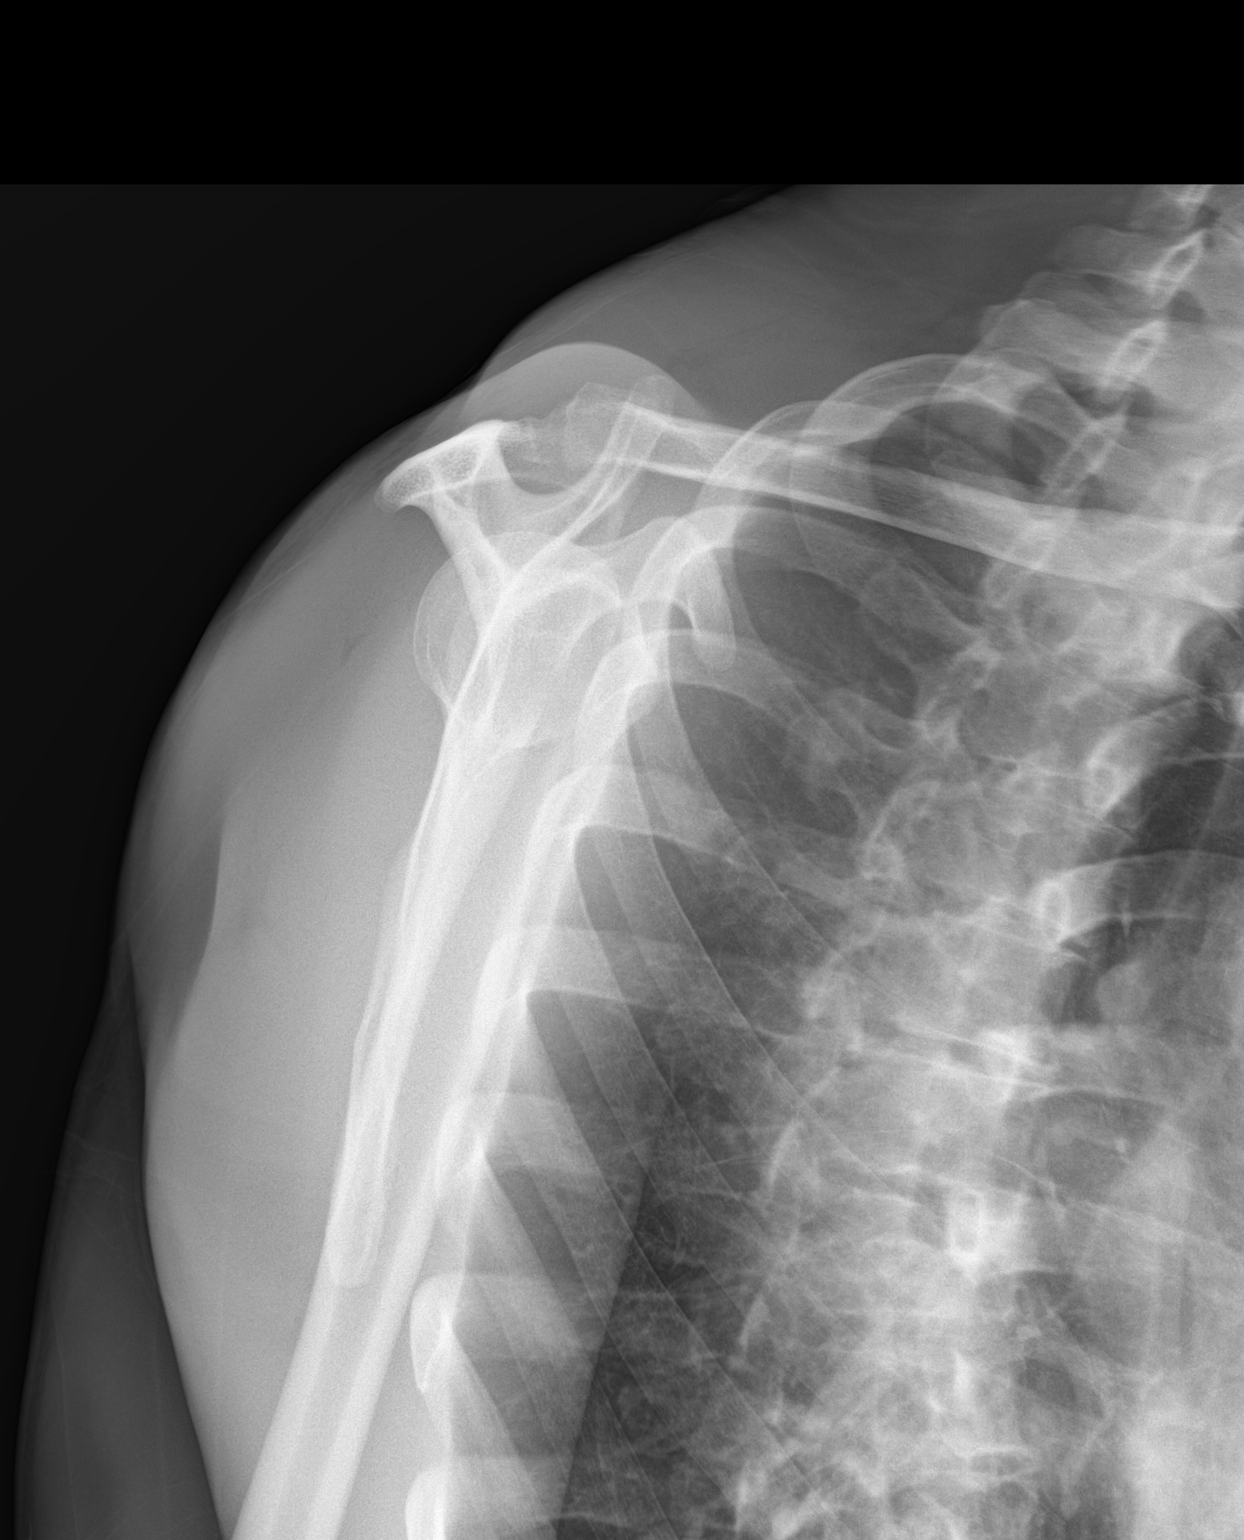

[shoulder axial]
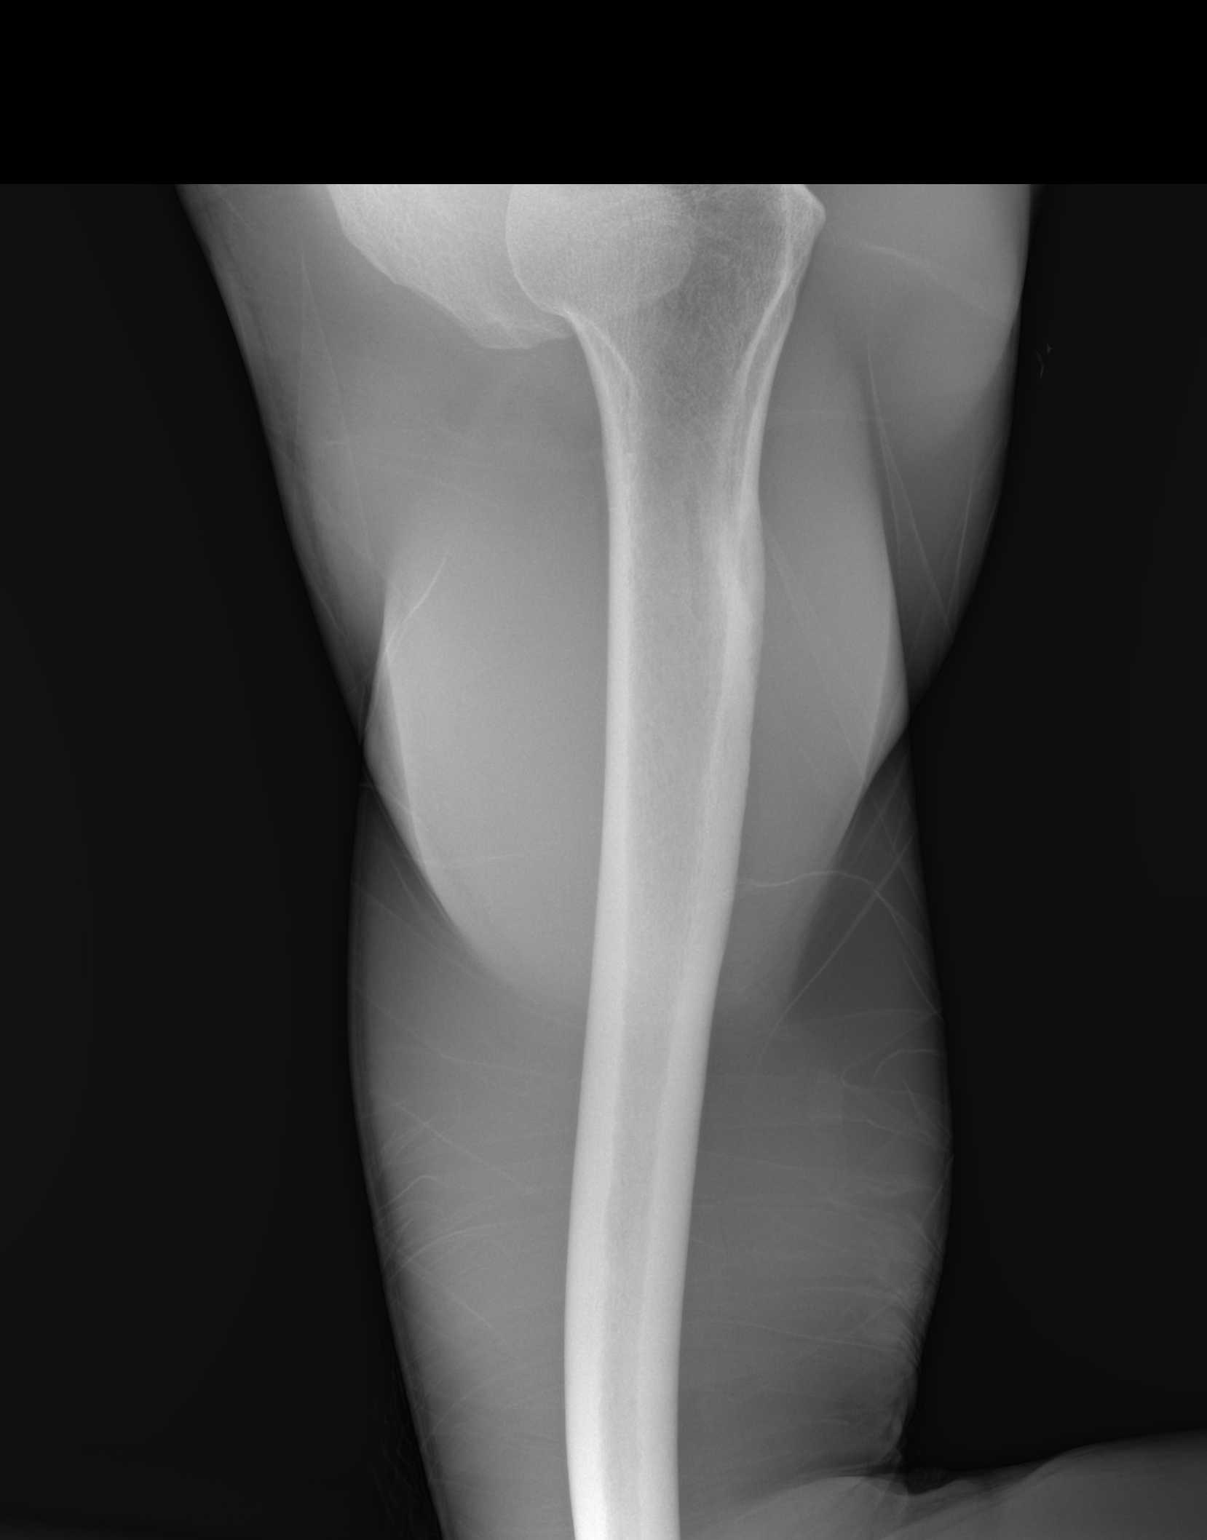

[3 of 3 positions shown; findings below may reference images not displayed]

FINDINGS: Moderate AC joint and mild glenohumeral degenerative change. No
fracture or malalignment.
IMPRESSION: Degenerative changes

## 2023-11-05 ENCOUNTER — Ambulatory Visit: Payer: Self-pay | Admitting: Family Medicine

## 2023-11-05 NOTE — Telephone Encounter (Signed)
 Summary: worsen shoulder pain rotator cuff   Copied From CRM [id number]. Reason for Triage: Patient Shoulder pain. Patient hurt his shoulder back in 1997 but never got it checked out And is getting worsen Call [phone] Patient would like to be establish at [facility] primary care     Chief Complaint: shoulder pain Symptoms: worsening shoulder pain Frequency: intermittent Pertinent Negatives: Patient denies chest pain Disposition: [] ED /[] Urgent Care (no appt availability in office) / [x] Appointment(In office/virtual)/ []  [facility]/ [] Home Care/ [] Refused Recommended Disposition /[] [facility]/ []  Follow-up with PCP Additional Notes: Patient reports right sholder pain for several years, but getting worse recently. States that he has been seen for this before with no resolve. 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 He currently only uses Biofreeze.  Pt would like to transfer his care to [facility], TOC appt scheduled for April. Acute visit scheduled as well for exacerbation of symptoms at [facility] on 2/21. Care advice given. Pt verbalized understanding    Reason for Disposition  Shoulder pain is a chronic symptom (recurrent or ongoing AND present > 4 weeks)  Answer Assessment - Initial Assessment Questions 1. ONSET: "When did the pain start?"     Has been dealing with pain for 30years, "getting worse due to the age"  2. LOCATION: "Where is the pain located?"     Right shoulder  3. PAIN: "How bad is the pain?" (Scale 1-10; or mild, moderate, severe)   - MILD (1-3): doesn't interfere with normal activities   - MODERATE (4-7): interferes with normal activities (e.g., work or school) or awakens from sleep   - SEVERE (8-10): excruciating pain, unable to do any normal activities, unable to move arm at all due to pain     No pain at this time  4. 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 CAUSE: "What do you think is causing the shoulder pain?"     Repetitive use  6. OTHER SYMPTOMS: "Do you have any other symptoms?" (e.g., neck pain, swelling, rash, fever, numbness, weakness)     No  7. PREGNANCY: "Is there any chance you are pregnant?" "When was your last menstrual period?"     N/a  Protocols used: Shoulder Pain-A-AH

## 2023-11-07 ENCOUNTER — Ambulatory Visit (INDEPENDENT_AMBULATORY_CARE_PROVIDER_SITE_OTHER): Payer: 59 | Admitting: Internal Medicine

## 2023-11-07 ENCOUNTER — Encounter: Payer: Self-pay | Admitting: Internal Medicine

## 2023-11-07 VITALS — BP 102/68 | HR 94 | Ht 73.0 in | Wt 148.0 lb

## 2023-11-07 DIAGNOSIS — M19011 Primary osteoarthritis, right shoulder: Secondary | ICD-10-CM | POA: Diagnosis not present

## 2023-11-07 DIAGNOSIS — Z720 Tobacco use: Secondary | ICD-10-CM | POA: Diagnosis not present

## 2023-11-07 MED ORDER — KETOROLAC TROMETHAMINE 60 MG/2ML IM SOLN
60.0000 mg | Freq: Once | INTRAMUSCULAR | Status: AC
  Administered 2023-11-07: 60 mg via INTRAMUSCULAR
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
## 2023-11-07 NOTE — Assessment & Plan Note (Signed)
 Previous x-ray of right shoulder showed degenerative changes and AC joint arthritis Toradol 60 mg IM today Advised to take ibuprofen as needed for pain and inflammation Referred to orthopedic surgery

## 2023-11-07 NOTE — Patient Instructions (Addendum)
 Please take Ibuprofen as needed for shoulder pain.  You are being referred to Orthopedic surgery.

## 2023-11-07 NOTE — Assessment & Plan Note (Signed)
 Smokes pipe, handful tobacco mixed in a day  Asked about quitting: confirms that he/she currently smokes pipe Advise to quit smoking: Educated about QUITTING to reduce the risk of cancer, cardio and cerebrovascular disease. Assess willingness: Unwilling to quit at this time Assist with counseling and pharmacotherapy: Counseled for 5 minutes Arrange for follow up: follow up and continue to offer help.

## 2023-11-14 ENCOUNTER — Ambulatory Visit: Payer: 59 | Admitting: Orthopedic Surgery

## 2023-11-14 ENCOUNTER — Encounter: Payer: Self-pay | Admitting: Orthopedic Surgery

## 2023-11-14 ENCOUNTER — Ambulatory Visit: Payer: Self-pay | Admitting: Internal Medicine

## 2023-11-14 VITALS — BP 119/71 | HR 88 | Ht 73.0 in | Wt 154.0 lb

## 2023-11-14 DIAGNOSIS — M19011 Primary osteoarthritis, right shoulder: Secondary | ICD-10-CM | POA: Diagnosis not present

## 2023-11-14 MED ORDER — METHYLPREDNISOLONE ACETATE 40 MG/ML IJ SUSP
40.0000 mg | Freq: Once | INTRAMUSCULAR | Status: AC
  Administered 2023-11-14: 40 mg via INTRA_ARTICULAR

## 2023-11-14 NOTE — Telephone Encounter (Signed)
  Chief Complaint: Pain post Steroid injection into Shoulder Symptoms: Pain Frequency: today Pertinent Negatives: Patient denies CP, SOB Disposition: [] ED /[] Urgent Care (no appt availability in office) / [] Appointment(In office/virtual)/ []  [facility]/ [x] Home Care/ [] Refused Recommended Disposition /[] [facility]/ []  Follow-up with PCP Additional Notes: patient is s/p steroid injection into shoulder today at ortho office. Patient endorsing pain to shoulder after injection. Patient did attempt to call ortho office but they are closed. Patient inquiring what OTC medications he could use. Patient educated on what he can use for pain control and how long he might have discomfort to the shoulder. Patient verbalized understanding and all questions answered.    Copied from CRM [id number]. Topic: Clinical - Red Word Triage >> Nov 14, 2023  1:13 PM Fredrica W wrote: Red Word that prompted transfer to Nurse Triage: Pain in should going through shoulder and down back Reason for Disposition  Shoulder pain  Answer Assessment - Initial Assessment Questions 1. ONSET: "When did the pain start?"     Developed pain after steroid injection into shoulder 2. LOCATION: "Where is the pain located?"     Right shoulder 3. PAIN: "How bad is the pain?" (Scale 1-10; or mild, moderate, severe)   - MILD (1-3): doesn't interfere with normal activities   - MODERATE (4-7): interferes with normal activities (e.g., work or school) or awakens from sleep   - SEVERE (8-10): excruciating pain, unable to do any normal activities, unable to move arm at all due to pain     Moderate 4. WORK OR EXERCISE: "Has there been any recent work or exercise that involved this part of the body?"     No 5. CAUSE: "What do you think is causing the shoulder pain?"     S/p steroid injection in shoulder 6. OTHER SYMPTOMS: "Do you have any other symptoms?" (e.g., neck pain, swelling, rash, fever, numbness, weakness)      No  Protocols used: Shoulder Pain-A-AH
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
## 2023-11-14 NOTE — Patient Instructions (Signed)

## 2023-11-17 NOTE — Telephone Encounter (Signed)
 There is some degree of pain after the numbing med wears off after a steroid injection. Needs to contact Ortho today as they are open if he is still having pain that has not improved

## 2023-11-18 NOTE — Telephone Encounter (Signed)
 Mailbox full

## 2024-01-06 ENCOUNTER — Encounter: Payer: Self-pay | Admitting: Internal Medicine

## 2024-02-24 ENCOUNTER — Encounter: Payer: Self-pay | Admitting: Internal Medicine

## 2024-02-24 ENCOUNTER — Ambulatory Visit: Payer: 59 | Admitting: Internal Medicine

## 2024-02-24 VITALS — BP 125/57 | HR 92 | Ht 73.0 in | Wt 146.4 lb

## 2024-02-24 DIAGNOSIS — M19011 Primary osteoarthritis, right shoulder: Secondary | ICD-10-CM | POA: Diagnosis not present

## 2024-02-24 DIAGNOSIS — H109 Unspecified conjunctivitis: Secondary | ICD-10-CM | POA: Insufficient documentation

## 2024-02-24 DIAGNOSIS — H1031 Unspecified acute conjunctivitis, right eye: Secondary | ICD-10-CM | POA: Diagnosis not present

## 2024-02-24 DIAGNOSIS — T148XXA Other injury of unspecified body region, initial encounter: Secondary | ICD-10-CM

## 2024-02-24 DIAGNOSIS — Z1329 Encounter for screening for other suspected endocrine disorder: Secondary | ICD-10-CM

## 2024-02-24 DIAGNOSIS — E782 Mixed hyperlipidemia: Secondary | ICD-10-CM | POA: Diagnosis not present

## 2024-02-24 DIAGNOSIS — E559 Vitamin D deficiency, unspecified: Secondary | ICD-10-CM

## 2024-02-24 DIAGNOSIS — L03019 Cellulitis of unspecified finger: Secondary | ICD-10-CM | POA: Insufficient documentation

## 2024-02-24 MED ORDER — CLOBETASOL PROPIONATE 0.05 % EX CREA: 1.0000 | TOPICAL_CREAM | Freq: Two times a day (BID) | CUTANEOUS | 0 refills | Status: DC

## 2024-02-24 MED ORDER — OFLOXACIN 0.3 % OP SOLN: 1.0000 [drp] | Freq: Four times a day (QID) | OPHTHALMIC | 0 refills | Status: AC

## 2024-02-24 NOTE — Assessment & Plan Note (Signed)
 Previous x-ray of right shoulder showed degenerative changes and AC joint arthritis Advised to take ibuprofen  as needed for pain and inflammation Referred to orthopedic surgery - has had steroid injection with adequate relief, advised to contact if recurrent symptoms
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
## 2024-02-24 NOTE — Assessment & Plan Note (Signed)
 Right eye erythema and discharge likely due to bacterial conjunctivitis Ofloxacin eyedrop prescribed Advised to contact eye clinic if he has visual disturbance

## 2024-02-24 NOTE — Patient Instructions (Addendum)
 Please soak finger in the warm water and then apply Clobetasol cream for local relief.  Please follow up with Orthopedic surgeon for chronic fingernail concerns.  Please apply Ofloxacin eye drops for right eye infection.  Please get fasting blood tests done before the next visit.

## 2024-02-24 NOTE — Assessment & Plan Note (Signed)
 Reports bilateral splinters near fingernails, recurrent due to wood work Advised to perform warm water soaking, can try clobetasol cream for local irritation Referred to orthopedic clinic

## 2024-09-27 ENCOUNTER — Ambulatory Visit: Admitting: Internal Medicine

## 2024-09-27 ENCOUNTER — Ambulatory Visit

## 2024-09-27 ENCOUNTER — Encounter: Payer: Self-pay | Admitting: Internal Medicine

## 2024-09-27 ENCOUNTER — Ambulatory Visit (HOSPITAL_COMMUNITY)
Admission: RE | Admit: 2024-09-27 | Discharge: 2024-09-27 | Disposition: A | Discharge status: Home / Self Care | Source: Ambulatory Visit | Attending: Internal Medicine | Admitting: Internal Medicine

## 2024-09-27 VITALS — Ht 73.0 in

## 2024-09-27 VITALS — BP 121/79 | HR 97 | Resp 12 | Ht 73.0 in | Wt 147.0 lb

## 2024-09-27 DIAGNOSIS — T148XXA Other injury of unspecified body region, initial encounter: Secondary | ICD-10-CM

## 2024-09-27 DIAGNOSIS — M25532 Pain in left wrist: Secondary | ICD-10-CM | POA: Insufficient documentation

## 2024-09-27 DIAGNOSIS — Z5320 Procedure and treatment not carried out because of patient's decision for unspecified reasons: Secondary | ICD-10-CM | POA: Diagnosis not present

## 2024-09-27 DIAGNOSIS — L03019 Cellulitis of unspecified finger: Secondary | ICD-10-CM

## 2024-09-27 DIAGNOSIS — Z720 Tobacco use: Secondary | ICD-10-CM

## 2024-09-27 DIAGNOSIS — Z0000 Encounter for general adult medical examination without abnormal findings: Secondary | ICD-10-CM

## 2024-09-27 DIAGNOSIS — Z0001 Encounter for general adult medical examination with abnormal findings: Secondary | ICD-10-CM

## 2024-09-27 DIAGNOSIS — M19011 Primary osteoarthritis, right shoulder: Secondary | ICD-10-CM

## 2024-09-27 MED ORDER — CLOBETASOL PROPIONATE 0.05 % EX CREA: 1.0000 | TOPICAL_CREAM | Freq: Two times a day (BID) | CUTANEOUS | 1 refills | Status: AC
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
## 2024-09-27 NOTE — Addendum Note (Signed)
 Addended byBETHA TOBIE DOWNS on: 09/27/2024 08:19 AM   Modules accepted: Level of Service

## 2024-09-27 NOTE — Assessment & Plan Note (Signed)
 Reports bilateral splinters near fingernails, recurrent due to wood work Advised to perform warm water soaking, can try clobetasol cream for local irritation Referred to orthopedic clinic

## 2024-09-27 NOTE — Assessment & Plan Note (Signed)
 Smokes pipe, handful tobacco mixed in a day  Asked about quitting: confirms that he/she currently smokes pipe Advise to quit smoking: Educated about QUITTING to reduce the risk of cancer, cardio and cerebrovascular disease. Assess willingness: Unwilling to quit at this time Assist with counseling and pharmacotherapy: Counseled for 5 minutes Arrange for follow up: follow up and continue to offer help.

## 2024-09-27 NOTE — Assessment & Plan Note (Addendum)
 Reports bilateral splinters near fingernails, recurrent due to wood work Advised to perform warm water soaking, can try clobetasol  cream for local irritation

## 2024-09-27 NOTE — Assessment & Plan Note (Signed)
 Previous x-ray of right shoulder showed degenerative changes and AC joint arthritis Advised to take ibuprofen  as needed for pain and inflammation Referred to orthopedic surgery - has had steroid injection with adequate relief, advised to contact if recurrent symptoms

## 2024-09-27 NOTE — Patient Instructions (Signed)
 [name],  Thank you for taking the time for your Medicare Wellness Visit. I appreciate your continued commitment to your health goals. Please review the care plan we discussed, and feel free to reach out if I can assist you further.  Please note that Annual Wellness Visits do not include a physical exam. Some assessments may be limited, especially if the visit was conducted virtually. If needed, we may recommend an in-person follow-up with your provider.  Ongoing Care Seeing your primary care provider every 3 to 6 months helps us  monitor your health and provide consistent, personalized care.   1 year follow up for Medicare well visit: September 28, 2025 at 8:00 am with medicare wellness nurse in office  Referrals If a referral was made during today's visit and you haven't received any updates within two weeks, please contact the referred provider directly to check on the status.  No referrals placed during todays visit.  Recommended Screenings:  Health Maintenance  Topic Date Due   Pneumococcal Vaccine for age over 56 (1 of 2 - PCV) Never done   Hepatitis B Vaccine (1 of 3 - 19+ 3-dose series) Never done   Colon Cancer Screening  Never done   Screening for Lung Cancer  Never done   Flu Shot  Never done   COVID-19 Vaccine (1 - 2025-26 season) Never done   Medicare Annual Wellness Visit  09/03/2024   DTaP/Tdap/Td vaccine (3 - Td or Tdap) 10/26/2030   Hepatitis C Screening  Completed   HIV Screening  Completed   HPV Vaccine  Aged Out   Meningitis B Vaccine  Aged Out   Zoster (Shingles) Vaccine  Discontinued       09/27/2024    8:08 AM  Advanced Directives  Does Patient Have a Medical Advance Directive? No  Would patient like information on creating a medical advance directive? No - Patient declined    Vision: Annual vision screenings are recommended for early detection of glaucoma, cataracts, and diabetic retinopathy. These exams can also reveal signs of chronic conditions  such as diabetes and high blood pressure.  Dental: Annual dental screenings help detect early signs of oral cancer, gum disease, and other conditions linked to overall health, including heart disease and diabetes.  Please see the attached documents for additional preventive care recommendations.

## 2024-09-27 NOTE — Progress Notes (Signed)
 "  Patient declined all health screenings Chief Complaint  Patient presents with   Medicare Wellness     Subjective:   CLARION MOONEYHAN is a 54 y.o. male who presents for a Medicare Annual Wellness Visit.  Visit info / Clinical Intake: Medicare Wellness Visit Type:: Subsequent Annual Wellness Visit Persons participating in visit and providing information:: patient Medicare Wellness Visit Mode:: In-person (required for WTM) Interpreter Needed?: No Pre-visit prep was completed: yes AWV questionnaire completed by patient prior to visit?: no Living arrangements:: (!) lives alone Patient's Overall Health Status Rating: excellent Typical amount of pain: (!) a lot Does pain affect daily life?: (!) yes Are you currently prescribed opioids?: no  Dietary Habits and Nutritional Risks How many meals a day?: (!) 1 Eats fruit and vegetables daily?: (!) no Most meals are obtained by: preparing own meals In the last 2 weeks, have you had any of the following?: none Diabetic:: no  Functional Status Activities of Daily Living (to include ambulation/medication): Independent Ambulation: Independent Medication Administration: Independent Home Management (perform basic housework or laundry): Independent Manage your own finances?: yes Primary transportation is: driving Concerns about vision?: no *vision screening is required for WTM* Concerns about hearing?: no  Fall Screening Falls in the past year?: 0 Number of falls in past year: 0 Was there an injury with Fall?: 0 Fall Risk Category Calculator: 0 Patient Fall Risk Level: Low Fall Risk  Fall Risk Patient at Risk for Falls Due to: No Fall Risks Fall risk Follow up: Falls evaluation completed; Education provided; Falls prevention discussed  Home and Transportation Safety: All rugs have non-skid backing?: yes All stairs or steps have railings?: N/A, no stairs Grab bars in the bathtub or shower?: (!) no Have non-skid surface in  bathtub or shower?: (!) no Good home lighting?: yes Regular seat belt use?: yes Hospital stays in the last year:: no  Cognitive Assessment Difficulty concentrating, remembering, or making decisions? : no Will 6CIT or Mini Cog be Completed: yes What year is it?: 0 points What month is it?: 0 points Give patient an address phrase to remember (5 components): [address] [place] About what time is it?: 0 points Count backwards from 20 to 1: 0 points Say the months of the year in reverse: 0 points Repeat the address phrase from earlier: 0 points 6 CIT Score: 0 points  Advance Directives (For Healthcare) Does Patient Have a Medical Advance Directive?: No Would patient like information on creating a medical advance directive?: No - Patient declined  Reviewed/Updated  Reviewed/Updated: Reviewed All (Medical, Surgical, Family, Medications, Allergies, Care Teams, Patient Goals)    Allergies (verified) Haloperidol, Bee pollen, and Haldol [haloperidol lactate]   Current Medications (verified) Outpatient Encounter Medications as of 09/27/2024  Medication Sig   clobetasol  cream (TEMOVATE ) 0.05 % Apply 1 Application topically 2 (two) times daily.   Multiple Vitamins-Minerals (MULTIVITAMIN WITH MINERALS) tablet Take 1 tablet by mouth daily.   ofloxacin  (OCUFLOX ) 0.3 % ophthalmic solution Place 1 drop into the right eye 4 (four) times daily.   No facility-administered encounter medications on file as of 09/27/2024.    History: History reviewed. No pertinent past medical history. Past Surgical History:  Procedure Laterality Date   HERNIA REPAIR     Family History  Problem Relation Age of Onset   Cancer Maternal Grandmother    Cancer Maternal Grandfather    COPD Paternal Grandmother    Cancer Paternal Grandmother    Social History   Occupational History  Not on file  Tobacco Use   Smoking status: Every Day    Current packs/day: 0.50    Average packs/day: 0.5 packs/day for  43.0 years (21.5 ttl pk-yrs)    Types: Cigarettes    Start date: 67   Smokeless tobacco: Never  Vaping Use   Vaping status: Never Used  Substance and Sexual Activity   Alcohol use: Not Currently    Comment: stopped 8 years ago   Drug use: Yes    Types: Marijuana   Sexual activity: Yes    Birth control/protection: None   Tobacco Counseling Ready to quit: No Counseling given: Yes  SDOH Screenings   Food Insecurity: No Food Insecurity (09/27/2024)  Housing: Low Risk (09/27/2024)  Transportation Needs: No Transportation Needs (09/27/2024)  Utilities: Not At Risk (09/27/2024)  Alcohol Screen: Low Risk (08/12/2022)  Depression (PHQ2-9): Low Risk (09/27/2024)  Financial Resource Strain: Low Risk (08/12/2022)  Physical Activity: Sufficiently Active (09/27/2024)  Social Connections: Moderately Isolated (09/27/2024)  Stress: No Stress Concern Present (09/27/2024)  Tobacco Use: High Risk (09/27/2024)  Health Literacy: Adequate Health Literacy (09/27/2024)   See flowsheets for full screening details  Depression Screen PHQ 2 & 9 Depression Scale- Over the past 2 weeks, how often have you been bothered by any of the following problems? Little interest or pleasure in doing things: 0 Feeling down, depressed, or hopeless (PHQ Adolescent also includes...irritable): 0 PHQ-2 Total Score: 0 Trouble falling or staying asleep, or sleeping too much: 0 Feeling tired or having little energy: 0 Poor appetite or overeating (PHQ Adolescent also includes...weight loss): 0 Feeling bad about yourself - or that you are a failure or have let yourself or your family down: 0 Trouble concentrating on things, such as reading the newspaper or watching television (PHQ Adolescent also includes...like school work): 0 Moving or speaking so slowly that other people could have noticed. Or the opposite - being so fidgety or restless that you have been moving around a lot more than usual: 0 Thoughts that you would be better  off dead, or of hurting yourself in some way: 0 PHQ-9 Total Score: 0 If you checked off any problems, how difficult have these problems made it for you to do your work, take care of things at home, or get along with other people?: Not difficult at all  Depression Treatment Depression Interventions/Treatment : EYV7-0 Score <4 Follow-up Not Indicated     Goals Addressed               This Visit's Progress     I'd like to gain about 20lbs (pt-stated)               Objective:    Today's Vitals   09/27/24 0802  BP: 121/79  Pulse: 97  Resp: 12  SpO2: 98%  Weight: 147 lb (66.7 kg)  Height: 6' 1 (1.854 m)   Body mass index is 19.39 kg/m.  Hearing/Vision screen Hearing Screening - Comments:: Patient denies any hearing difficulties.   Vision Screening - Comments:: Patient wears contacts. He is up to date with eye exams with Dr Ladora at [facility] in [place]  Immunizations and Health Maintenance Health Maintenance  Topic Date Due   Pneumococcal Vaccine: 50+ Years (1 of 2 - PCV) Never done   Hepatitis B Vaccines 19-59 Average Risk (1 of 3 - 19+ 3-dose series) Never done   Colonoscopy  Never done   Lung Cancer Screening  Never done   Influenza Vaccine  Never done  COVID-19 Vaccine (1 - 2025-26 season) Never done   Medicare Annual Wellness (AWV)  09/03/2024   DTaP/Tdap/Td (3 - Td or Tdap) 10/26/2030   Hepatitis C Screening  Completed   HIV Screening  Completed   HPV VACCINES  Aged Out   Meningococcal B Vaccine  Aged Out   Zoster Vaccines- Shingrix  Discontinued        Assessment/Plan:  This is a routine wellness examination for Sophia.  Patient Care Team: Tobie Suzzane POUR, MD as PCP - General (Internal Medicine) Ladora Ross Lacy Phebe, MD as Referring Physician [facility])  I have personally reviewed and noted the following in the patients chart:   Medical and social history Use of alcohol, tobacco or illicit drugs  Current medications and supplements including  opioid prescriptions. Functional ability and status Nutritional status Physical activity Advanced directives List of other physicians Hospitalizations, surgeries, and ER visits in previous 12 months Vitals Screenings to include cognitive, depression, and falls Referrals and appointments  No orders of the defined types were placed in this encounter.  In addition, I have reviewed and discussed with patient certain preventive protocols, quality metrics, and best practice recommendations. A written personalized care plan for preventive services as well as general preventive health recommendations were provided to patient.   Akashdeep Chuba, CMA   09/27/2024   No follow-ups on file.  After Visit Summary: (In Person-Declined) Patient declined AVS at this time.   "

## 2024-09-27 NOTE — Patient Instructions (Addendum)
 Please get X-ray of left wrist done at [facility].  Please soak finger in the warm water and then apply Clobetasol  cream for local relief.   Please follow up with Orthopedic surgeon for chronic shoulder and wrist pain.

## 2024-09-27 NOTE — Assessment & Plan Note (Signed)
 Check x-ray of left wrist Can use Voltaren gel as needed for pain Prefers to avoid oral medicines Refer to orthopedic surgery

## 2024-10-05 ENCOUNTER — Other Ambulatory Visit: Payer: Self-pay

## 2024-10-05 ENCOUNTER — Telehealth: Payer: Self-pay | Admitting: Internal Medicine

## 2024-10-05 DIAGNOSIS — G8929 Other chronic pain: Secondary | ICD-10-CM

## 2024-10-05 NOTE — Telephone Encounter (Signed)
 Referral placed

## 2024-10-05 NOTE — Telephone Encounter (Signed)
 Please advise was seen for shoulder pain in office on 01.12.2025 for both wrist and shoulder.    Copied from CRM [id number]. Topic: Referral - Question >> Oct 05, 2024  9:41 AM Jeoffrey H wrote: Reason for CRM: Patient stated he received a call from [facility] and they only mentioned his left wrist pain. He stated he was really focused on his right shoulder pain. Patient wanted someone to reach out to the facility about his right shoulder pain also.    [phone]

## 2024-10-06 ENCOUNTER — Ambulatory Visit (INDEPENDENT_AMBULATORY_CARE_PROVIDER_SITE_OTHER): Admitting: Orthopedic Surgery

## 2024-10-06 ENCOUNTER — Other Ambulatory Visit: Payer: Self-pay

## 2024-10-06 DIAGNOSIS — G8929 Other chronic pain: Secondary | ICD-10-CM | POA: Diagnosis not present

## 2024-10-06 DIAGNOSIS — M19011 Primary osteoarthritis, right shoulder: Secondary | ICD-10-CM

## 2024-10-06 DIAGNOSIS — M25511 Pain in right shoulder: Secondary | ICD-10-CM | POA: Diagnosis not present

## 2024-10-06 NOTE — Patient Instructions (Signed)

## 2024-10-07 ENCOUNTER — Encounter: Payer: Self-pay | Admitting: Orthopedic Surgery
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
## 2024-11-05 ENCOUNTER — Encounter: Payer: Self-pay | Admitting: Internal Medicine

## 2025-03-29 ENCOUNTER — Ambulatory Visit: Payer: Self-pay | Admitting: Internal Medicine

## 2025-09-28 ENCOUNTER — Ambulatory Visit: Payer: Self-pay
